# Patient Record
Sex: Female | Born: 1990 | Race: White | Hispanic: No | Marital: Single | Smoking: Never smoker
Health system: Southern US, Community
[De-identification: ages and names within clinical notes are randomized; demographics above are authoritative.]

## PROBLEM LIST (undated history)

## (undated) DIAGNOSIS — T7840XA Allergy, unspecified, initial encounter: Secondary | ICD-10-CM

## (undated) DIAGNOSIS — F909 Attention-deficit hyperactivity disorder, unspecified type: Secondary | ICD-10-CM

## (undated) HISTORY — DX: Allergy, unspecified, initial encounter: T78.40XA

## (undated) HISTORY — DX: Attention-deficit hyperactivity disorder, unspecified type: F90.9

---

## 2010-06-27 ENCOUNTER — Emergency Department (HOSPITAL_COMMUNITY)
Admission: EM | Admit: 2010-06-27 | Discharge: 2010-06-27 | Payer: Self-pay | Source: Home / Self Care | Admitting: Emergency Medicine

## 2011-02-26 LAB — CBC
HCT: 39.2 % (ref 36.0–46.0)
MCH: 31.5 pg (ref 26.0–34.0)
MCHC: 34.5 g/dL (ref 30.0–36.0)
WBC: 5.5 10*3/uL (ref 4.0–10.5)

## 2011-02-26 LAB — DIFFERENTIAL
Eosinophils Absolute: 0.1 10*3/uL (ref 0.0–0.7)
Eosinophils Relative: 1 % (ref 0–5)
Lymphocytes Relative: 42 % (ref 12–46)
Monocytes Relative: 7 % (ref 3–12)
Neutrophils Relative %: 49 % (ref 43–77)

## 2011-02-26 LAB — URINE MICROSCOPIC-ADD ON

## 2011-02-26 LAB — POCT I-STAT, CHEM 8
BUN: 16 mg/dL (ref 6–23)
Calcium, Ion: 1.27 mmol/L (ref 1.12–1.32)
Chloride: 105 mEq/L (ref 96–112)
Creatinine, Ser: 0.9 mg/dL (ref 0.4–1.2)
Hemoglobin: 14.3 g/dL (ref 12.0–15.0)
Potassium: 3.8 mEq/L (ref 3.5–5.1)
Sodium: 141 mEq/L (ref 135–145)
TCO2: 28 mmol/L (ref 0–100)

## 2011-02-26 LAB — URINALYSIS, ROUTINE W REFLEX MICROSCOPIC
Bilirubin Urine: NEGATIVE
Specific Gravity, Urine: 1.022 (ref 1.005–1.030)
pH: 6 (ref 5.0–8.0)

## 2013-06-25 ENCOUNTER — Encounter: Payer: Self-pay | Admitting: Obstetrics & Gynecology

## 2013-10-21 ENCOUNTER — Encounter: Payer: Self-pay | Admitting: Obstetrics & Gynecology

## 2013-10-21 ENCOUNTER — Ambulatory Visit (INDEPENDENT_AMBULATORY_CARE_PROVIDER_SITE_OTHER): Admitting: Obstetrics & Gynecology

## 2013-10-21 VITALS — BP 116/76 | HR 79 | Temp 98.2°F | Ht 61.0 in | Wt 115.0 lb

## 2013-10-21 DIAGNOSIS — Z0000 Encounter for general adult medical examination without abnormal findings: Secondary | ICD-10-CM

## 2013-10-21 DIAGNOSIS — Z01419 Encounter for gynecological examination (general) (routine) without abnormal findings: Secondary | ICD-10-CM

## 2013-10-21 LAB — POCT URINALYSIS DIPSTICK
Blood, UA: NEGATIVE
Glucose, UA: NEGATIVE
Ketones, UA: NEGATIVE
Leukocytes, UA: NEGATIVE
Protein, UA: NEGATIVE
Spec Grav, UA: 1.015
pH, UA: 6

## 2013-10-21 NOTE — Progress Notes (Signed)
Subjective:     Samantha Mcpherson is a 22 y.o. female here for a routine exam.  Current complaints: annual exam- patient reports she is doing well. Patient was treated recently for bladder infection..  Personal health questionnaire reviewed: no.   Gynecologic History Patient's last menstrual period was 10/11/2013. Contraception: OCP (estrogen/progesterone) Last Pap: never.   Obstetric History OB History  No data available     The following portions of the patient's history were reviewed and updated as appropriate: allergies, current medications, past family history, past medical history, past social history, past surgical history and problem list.  Review of Systems Pertinent items are noted in HPI.    Objective:      General appearance: alert Breasts: normal appearance, no masses or tenderness Abdomen: soft, non-tender; bowel sounds normal; no masses,  no organomegaly Pelvic: cervix normal in appearance, external genitalia normal, no adnexal masses or tenderness, uterus normal size, shape, and consistency and vagina normal without discharge       Assessment:    Healthy female exam.    Plan:   Counseled re: HPV vaccine Continue COCP F/U in 1 yre

## 2013-10-22 LAB — PAP IG W/ RFLX HPV ASCU

## 2013-10-24 ENCOUNTER — Encounter: Payer: Self-pay | Admitting: Obstetrics & Gynecology

## 2013-10-24 NOTE — Patient Instructions (Signed)

## 2013-12-17 ENCOUNTER — Other Ambulatory Visit: Payer: Self-pay | Admitting: Obstetrics & Gynecology

## 2014-04-04 ENCOUNTER — Other Ambulatory Visit: Payer: Self-pay | Admitting: Obstetrics & Gynecology

## 2014-07-12 ENCOUNTER — Other Ambulatory Visit: Payer: Self-pay | Admitting: Obstetrics & Gynecology

## 2014-07-16 ENCOUNTER — Other Ambulatory Visit: Payer: Self-pay | Admitting: *Deleted

## 2014-10-26 ENCOUNTER — Other Ambulatory Visit: Payer: Self-pay | Admitting: Obstetrics & Gynecology

## 2014-10-29 ENCOUNTER — Other Ambulatory Visit: Payer: Self-pay | Admitting: *Deleted

## 2014-10-29 MED ORDER — DROSPIRENONE-ETHINYL ESTRADIOL 3-0.02 MG PO TABS: ORAL_TABLET | ORAL | Status: DC

## 2014-10-29 NOTE — Progress Notes (Signed)
Pharmacy call to office requesting refill on [facility]. Call placed to pt making her aware that refill has been sent and that she will need to make an AEX appt to continue refill on medication.  Pt to have AEX scheduled in December.

## 2014-11-18 ENCOUNTER — Other Ambulatory Visit: Payer: Self-pay | Admitting: *Deleted

## 2014-11-18 DIAGNOSIS — Z3041 Encounter for surveillance of contraceptive pills: Secondary | ICD-10-CM

## 2014-11-18 MED ORDER — DROSPIRENONE-ETHINYL ESTRADIOL 3-0.02 MG PO TABS: ORAL_TABLET | ORAL | Status: DC

## 2014-12-08 ENCOUNTER — Ambulatory Visit: Admitting: Obstetrics & Gynecology

## 2014-12-08 ENCOUNTER — Encounter: Payer: Self-pay | Admitting: *Deleted

## 2014-12-09 ENCOUNTER — Encounter: Payer: Self-pay | Admitting: Obstetrics & Gynecology

## 2015-02-05 ENCOUNTER — Ambulatory Visit: Payer: Self-pay | Admitting: Family Medicine

## 2015-02-12 ENCOUNTER — Encounter: Payer: Self-pay | Admitting: Obstetrics

## 2015-02-12 ENCOUNTER — Ambulatory Visit (INDEPENDENT_AMBULATORY_CARE_PROVIDER_SITE_OTHER): Admitting: Obstetrics

## 2015-02-12 VITALS — BP 113/72 | HR 80 | Temp 98.6°F | Ht 61.0 in | Wt 119.0 lb

## 2015-02-12 DIAGNOSIS — Z3041 Encounter for surveillance of contraceptive pills: Secondary | ICD-10-CM

## 2015-02-12 DIAGNOSIS — Z01419 Encounter for gynecological examination (general) (routine) without abnormal findings: Secondary | ICD-10-CM | POA: Diagnosis not present

## 2015-02-12 MED ORDER — DROSPIRENONE-ETHINYL ESTRADIOL 3-0.02 MG PO TABS: ORAL_TABLET | ORAL | Status: DC

## 2015-02-13 ENCOUNTER — Encounter: Payer: Self-pay | Admitting: Obstetrics

## 2015-02-13 LAB — PAP IG W/ RFLX HPV ASCU

## 2015-02-13 NOTE — Progress Notes (Signed)
Subjective:        Samantha Mcpherson is a 24 y.o. female here for a routine exam.  Current complaints: None.    Personal health questionnaire:  Is patient [name], have a family history of breast and/or ovarian cancer: no Is there a family history of uterine cancer diagnosed at age < 42, gastrointestinal cancer, urinary tract cancer, family member who is a Field seismologist syndrome-associated carrier: no Is the patient overweight and hypertensive, family history of diabetes, personal history of gestational diabetes, preeclampsia or PCOS: no Is patient over 62, have PCOS,  family history of premature CHD under age 78, diabetes, smoke, have hypertension or peripheral artery disease:  no At any time, has a partner hit, kicked or otherwise hurt or frightened you?: no Over the past 2 weeks, have you felt down, depressed or hopeless?: no Over the past 2 weeks, have you felt little interest or pleasure in doing things?:no   Gynecologic History Patient's last menstrual period was 01/11/2015. Contraception: OCP (estrogen/progesterone) Last Pap: 2015. Results were: normal Last mammogram: n/a. Results were: n/a  Obstetric History OB History  No data available    Past Medical History  Diagnosis Date  . Allergy   . ADHD (attention deficit hyperactivity disorder)     History reviewed. No pertinent past surgical history.   Current outpatient prescriptions:  .  atomoxetine (STRATTERA) 10 MG capsule, Take 10 mg by mouth daily., Disp: , Rfl:  .  drospirenone-ethinyl estradiol (LORYNA) 3-0.02 MG tablet, TAKE AS DIRECTED, Disp: 28 tablet, Rfl: 2 .  fexofenadine (ALLEGRA) 30 MG tablet, Take 30 mg by mouth 2 (two) times daily., Disp: , Rfl:  .  Multiple Vitamins-Minerals (MULTIVITAMIN WITH MINERALS) tablet, Take 1 tablet by mouth daily., Disp: , Rfl:  Allergies  Allergen Reactions  . Amoxicillin Rash    History  Substance Use Topics  . Smoking status: Never Smoker   . Smokeless tobacco: Not  on file  . Alcohol Use: Yes     Comment: sometimes    Family History  Problem Relation Age of Onset  . Crohn's disease Mother   . Cancer Maternal Grandmother   . Heart disease Maternal Grandfather   . Heart disease Paternal Grandmother   . Stroke Paternal Grandmother       Review of Systems  Constitutional: negative for fatigue and weight loss Respiratory: negative for cough and wheezing Cardiovascular: negative for chest pain, fatigue and palpitations Gastrointestinal: negative for abdominal pain and change in bowel habits Musculoskeletal:negative for myalgias Neurological: negative for gait problems and tremors Behavioral/Psych: negative for abusive relationship, depression Endocrine: negative for temperature intolerance   Genitourinary:negative for abnormal menstrual periods, genital lesions, hot flashes, sexual problems and vaginal discharge Integument/breast: negative for breast lump, breast tenderness, nipple discharge and skin lesion(s)    Objective:       BP 113/72 mmHg  Pulse 80  Temp(Src) 98.6 F (37 C)  Ht 5\' 1"  (1.549 m)  Wt 119 lb (53.978 kg)  BMI 22.50 kg/m2  LMP 01/11/2015 General:   alert  Skin:   no rash or abnormalities  Lungs:   clear to auscultation bilaterally  Heart:   regular rate and rhythm, S1, S2 normal, no murmur, click, rub or gallop  Breasts:   normal without suspicious masses, skin or nipple changes or axillary nodes  Abdomen:  normal findings: no organomegaly, soft, non-tender and no hernia  Pelvis:  External genitalia: normal general appearance Urinary system: urethral meatus normal and bladder without fullness, nontender Vaginal:  normal without tenderness, induration or masses Cervix: normal appearance Adnexa: normal bimanual exam Uterus: anteverted and non-tender, normal size   Lab Review Urine pregnancy test Labs reviewed yes Radiologic studies reviewed no    Assessment:    Healthy female exam.    Contraceptive  Surveillance.  Pleased with OCP's   Plan:    Education reviewed: safe sex/STD prevention and weight bearing exercise. Contraception: OCP (estrogen/progesterone). Follow up in: 1 year.   Meds ordered this encounter  Medications  . drospirenone-ethinyl estradiol (LORYNA) 3-0.02 MG tablet    Sig: TAKE AS DIRECTED    Dispense:  28 tablet    Refill:  2   Orders Placed This Encounter  Procedures  . SureSwab, Vaginosis/Vaginitis Plus

## 2015-02-17 LAB — SURESWAB, VAGINOSIS/VAGINITIS PLUS
Atopobium vaginae: NOT DETECTED Log (cells/mL)
C. GLABRATA, DNA: NOT DETECTED
C. albicans, DNA: NOT DETECTED
C. parapsilosis, DNA: NOT DETECTED
C. trachomatis RNA, TMA: NOT DETECTED
C. tropicalis, DNA: NOT DETECTED
GARDNERELLA VAGINALIS: NOT DETECTED Log (cells/mL)
LACTOBACILLUS SPECIES: 6.5 Log (cells/mL)
MEGASPHAERA SPECIES: NOT DETECTED Log (cells/mL)
N. gonorrhoeae RNA, TMA: NOT DETECTED
T. vaginalis RNA, QL TMA: NOT DETECTED

## 2015-08-26 DIAGNOSIS — K580 Irritable bowel syndrome with diarrhea: Secondary | ICD-10-CM | POA: Insufficient documentation

## 2016-02-15 ENCOUNTER — Ambulatory Visit (INDEPENDENT_AMBULATORY_CARE_PROVIDER_SITE_OTHER): Payer: 59 | Admitting: Obstetrics

## 2016-02-15 ENCOUNTER — Encounter: Payer: Self-pay | Admitting: Obstetrics

## 2016-02-15 VITALS — BP 119/78 | HR 94 | Temp 98.5°F | Ht 61.0 in | Wt 124.8 lb

## 2016-02-15 DIAGNOSIS — Z3041 Encounter for surveillance of contraceptive pills: Secondary | ICD-10-CM

## 2016-02-15 DIAGNOSIS — Z01419 Encounter for gynecological examination (general) (routine) without abnormal findings: Secondary | ICD-10-CM | POA: Diagnosis not present

## 2016-02-15 MED ORDER — DROSPIRENONE-ETHINYL ESTRADIOL 3-0.02 MG PO TABS: ORAL_TABLET | ORAL | Status: DC

## 2016-02-16 ENCOUNTER — Other Ambulatory Visit: Payer: Self-pay | Admitting: *Deleted

## 2016-02-16 ENCOUNTER — Encounter: Payer: Self-pay | Admitting: Obstetrics

## 2016-02-16 LAB — PAP IG W/ RFLX HPV ASCU

## 2016-02-16 NOTE — Progress Notes (Signed)
Subjective:        Samantha Mcpherson is a 25 y.o. female here for a routine exam.  Current complaints: None.    Personal health questionnaire:  Is patient Samantha Mcpherson, have a family history of breast and/or ovarian cancer: no Is there a family history of uterine cancer diagnosed at age < 43, gastrointestinal cancer, urinary tract cancer, family member who is a Field seismologist syndrome-associated carrier: no Is the patient overweight and hypertensive, family history of diabetes, personal history of gestational diabetes, preeclampsia or PCOS: no Is patient over 24, have PCOS,  family history of premature CHD under age 59, diabetes, smoke, have hypertension or peripheral artery disease:  no At any time, has a partner hit, kicked or otherwise hurt or frightened you?: no Over the past 2 weeks, have you felt down, depressed or hopeless?: no Over the past 2 weeks, have you felt little interest or pleasure in doing things?:no   Gynecologic History Patient's last menstrual period was 02/15/2016 (exact date). Contraception: OCP (estrogen/progesterone) Last Pap: 2016. Results were: normal Last mammogram: n/a. Results were: n/a  Obstetric History OB History  No data available    Past Medical History  Diagnosis Date  . Allergy   . ADHD (attention deficit hyperactivity disorder)     History reviewed. No pertinent past surgical history.   Current outpatient prescriptions:  .  atomoxetine (STRATTERA) 10 MG capsule, Take 10 mg by mouth daily., Disp: , Rfl:  .  drospirenone-ethinyl estradiol (LORYNA) 3-0.02 MG tablet, TAKE AS DIRECTED, Disp: 84 tablet, Rfl: 4 .  fexofenadine (ALLEGRA) 30 MG tablet, Take 30 mg by mouth 2 (two) times daily., Disp: , Rfl:  .  Multiple Vitamins-Minerals (MULTIVITAMIN WITH MINERALS) tablet, Take 1 tablet by mouth daily., Disp: , Rfl:  Allergies  Allergen Reactions  . Amoxicillin Rash    Social History  Substance Use Topics  . Smoking status: Never Smoker   .  Smokeless tobacco: Not on file  . Alcohol Use: 0.0 oz/week    0 Standard drinks or equivalent per week     Comment: sometimes    Family History  Problem Relation Age of Onset  . Crohn's disease Mother   . Cancer Maternal Grandmother   . Heart disease Maternal Grandfather   . Heart disease Paternal Grandmother   . Stroke Paternal Grandmother       Review of Systems  Constitutional: negative for fatigue and weight loss Respiratory: negative for cough and wheezing Cardiovascular: negative for chest pain, fatigue and palpitations Gastrointestinal: negative for abdominal pain and change in bowel habits Musculoskeletal:negative for myalgias Neurological: negative for gait problems and tremors Behavioral/Psych: negative for abusive relationship, depression Endocrine: negative for temperature intolerance   Genitourinary:negative for abnormal menstrual periods, genital lesions, hot flashes, sexual problems and vaginal discharge Integument/breast: negative for breast lump, breast tenderness, nipple discharge and skin lesion(s)    Objective:       BP 119/78 mmHg  Pulse 94  Temp(Src) 98.5 F (36.9 C)  Ht 5\' 1"  (1.549 m)  Wt 124 lb 12.8 oz (56.609 kg)  BMI 23.59 kg/m2  LMP 02/15/2016 (Exact Date) General:   alert  Skin:   no rash or abnormalities  Lungs:   clear to auscultation bilaterally  Heart:   regular rate and rhythm, S1, S2 normal, no murmur, click, rub or gallop  Breasts:   normal without suspicious masses, skin or nipple changes or axillary nodes  Abdomen:  normal findings: no organomegaly, soft, non-tender and no hernia  Pelvis:  External genitalia: normal general appearance Urinary system: urethral meatus normal and bladder without fullness, nontender Vaginal: normal without tenderness, induration or masses Cervix: normal appearance Adnexa: normal bimanual exam Uterus: anteverted and non-tender, normal size   Lab Review Urine pregnancy test Labs reviewed  yes Radiologic studies reviewed no    Assessment:    Healthy female exam.    Contraceptive management   Plan:    Education reviewed: low fat, low cholesterol diet, safe sex/STD prevention and weight bearing exercise. Contraception: OCP (estrogen/progesterone).    Meds ordered this encounter  Medications  . drospirenone-ethinyl estradiol (LORYNA) 3-0.02 MG tablet    Sig: TAKE AS DIRECTED    Dispense:  84 tablet    Refill:  4   Orders Placed This Encounter  Procedures  . SureSwab Bacterial Vaginosis/itis  . SureSwab, Vaginosis/Vaginitis Plus

## 2016-02-18 LAB — SURESWAB BACTERIAL VAGINOSIS/ITIS
ATOPOBIUM VAGINAE: NOT DETECTED Log (cells/mL)
C. PARAPSILOSIS, DNA: NOT DETECTED
C. TROPICALIS, DNA: NOT DETECTED
C. albicans, DNA: DETECTED — AB
C. glabrata, DNA: NOT DETECTED
LACTOBACILLUS SPECIES: 7.5 Log (cells/mL)
MEGASPHAERA SPECIES: NOT DETECTED Log (cells/mL)
T. VAGINALIS RNA, QL TMA: NOT DETECTED

## 2016-02-19 ENCOUNTER — Other Ambulatory Visit: Payer: Self-pay | Admitting: Obstetrics

## 2016-02-19 DIAGNOSIS — B3731 Acute candidiasis of vulva and vagina: Secondary | ICD-10-CM

## 2016-02-19 DIAGNOSIS — B373 Candidiasis of vulva and vagina: Secondary | ICD-10-CM

## 2016-02-19 MED ORDER — FLUCONAZOLE 150 MG PO TABS: 150.0000 mg | ORAL_TABLET | Freq: Once | ORAL | Status: DC

## 2016-12-16 DIAGNOSIS — D2262 Melanocytic nevi of left upper limb, including shoulder: Secondary | ICD-10-CM | POA: Diagnosis not present

## 2016-12-16 DIAGNOSIS — D485 Neoplasm of uncertain behavior of skin: Secondary | ICD-10-CM | POA: Diagnosis not present

## 2016-12-16 DIAGNOSIS — D225 Melanocytic nevi of trunk: Secondary | ICD-10-CM | POA: Diagnosis not present

## 2016-12-16 DIAGNOSIS — D2372 Other benign neoplasm of skin of left lower limb, including hip: Secondary | ICD-10-CM | POA: Diagnosis not present

## 2016-12-16 DIAGNOSIS — D1801 Hemangioma of skin and subcutaneous tissue: Secondary | ICD-10-CM | POA: Diagnosis not present

## 2017-02-15 ENCOUNTER — Ambulatory Visit: Payer: Self-pay | Admitting: Obstetrics

## 2017-02-16 DIAGNOSIS — D485 Neoplasm of uncertain behavior of skin: Secondary | ICD-10-CM | POA: Diagnosis not present

## 2017-06-03 ENCOUNTER — Encounter (HOSPITAL_COMMUNITY): Payer: Self-pay

## 2017-06-03 ENCOUNTER — Emergency Department (HOSPITAL_COMMUNITY): Payer: 59

## 2017-06-03 DIAGNOSIS — L02415 Cutaneous abscess of right lower limb: Secondary | ICD-10-CM | POA: Insufficient documentation

## 2017-06-03 DIAGNOSIS — M7989 Other specified soft tissue disorders: Secondary | ICD-10-CM | POA: Diagnosis present

## 2017-06-03 DIAGNOSIS — F909 Attention-deficit hyperactivity disorder, unspecified type: Secondary | ICD-10-CM | POA: Insufficient documentation

## 2017-06-03 DIAGNOSIS — M25561 Pain in right knee: Secondary | ICD-10-CM | POA: Diagnosis not present

## 2017-06-03 DIAGNOSIS — L03115 Cellulitis of right lower limb: Secondary | ICD-10-CM | POA: Diagnosis not present

## 2017-06-03 NOTE — ED Triage Notes (Signed)
Pt complaining of redness and swelling to R knee. Pt states seen by PCP, states given abx cream for same. Pt states increasing redness and swelling. Pt states fell onto R knee several weeks ago while walking dog. Pt denies any new injury/trauma since. Pt complaining of serosanguinous drainage.

## 2017-06-04 ENCOUNTER — Emergency Department (HOSPITAL_COMMUNITY)
Admission: EM | Admit: 2017-06-04 | Discharge: 2017-06-04 | Disposition: A | Discharge status: Home / Self Care | Payer: 59 | Attending: Emergency Medicine | Admitting: Emergency Medicine

## 2017-06-04 DIAGNOSIS — L03115 Cellulitis of right lower limb: Secondary | ICD-10-CM

## 2017-06-04 DIAGNOSIS — L02415 Cutaneous abscess of right lower limb: Secondary | ICD-10-CM

## 2017-06-04 MED ORDER — LIDOCAINE-EPINEPHRINE (PF) 2 %-1:200000 IJ SOLN
20.0000 mL | Freq: Once | INTRAMUSCULAR | Status: AC
  Administered 2017-06-04: 20 mL via INTRADERMAL
  Filled 2017-06-04: qty 20

## 2017-06-04 MED ORDER — DOXYCYCLINE HYCLATE 100 MG PO CAPS: 100.0000 mg | ORAL_CAPSULE | Freq: Two times a day (BID) | ORAL | 0 refills | Status: DC

## 2017-06-04 MED ORDER — DOXYCYCLINE HYCLATE 100 MG PO TABS
100.0000 mg | ORAL_TABLET | Freq: Once | ORAL | Status: AC
  Administered 2017-06-04: 100 mg via ORAL
  Filled 2017-06-04: qty 1

## 2017-06-04 NOTE — ED Provider Notes (Signed)
[name] DEPT Provider Note   CSN: [id number] Arrival date & time: 06/03/17  2225  By signing my name below, I, Ny'Kea Lewis, attest that this documentation has been prepared under the direction and in the presence of Sherwood Gambler, MD. Electronically Signed: Lise Auer, ED Scribe. 06/04/17. 1:59 AM.  History   Chief Complaint Chief Complaint  Patient presents with  . Knee Pain   The history is provided by the patient and a parent. No language interpreter was used.    HPI HPI Comments: Samantha Mcpherson is a 26 y.o. female with no pertinent history who presents to the Emergency Department complaining of gradually worsening right leg infection that began four days ago. Pt notes associated discomfort with ambulation, swelling, and redness to the area. Today she notes a new onset of redness and swelling to the right leg. Pt had a mechanical fall one week ago while walking her dog where she sustained a scrap to the right knee. She reports the area was healing when a small bump formed and she attempted to drain area that opened and continued to grow in size. She saw her PCP four days ago and she was prescribed an abx cream and Bactrim. She reports the dosage of the medication was too strong so she decreased the amount she took. She has been doing warm compresses and cleaning the area with peroxide with mild relief. Denies fever, nausea, or emesis.    Past Medical History:  Diagnosis Date  . ADHD (attention deficit hyperactivity disorder)   . Allergy     There are no active problems to display for this patient.   History reviewed. No pertinent surgical history.  OB History    No data available      Home Medications    Prior to Admission medications   Medication Sig Start Date End Date Taking? Authorizing Provider  atomoxetine (STRATTERA) 10 MG capsule Take 10 mg by mouth daily.    [provider]  doxycycline (VIBRAMYCIN) 100 MG capsule Take 1 capsule (100 mg  total) by mouth 2 (two) times daily. One po bid x 7 days 06/04/17   Sherwood Gambler, MD  drospirenone-ethinyl estradiol (LORYNA) 3-0.02 MG tablet TAKE AS DIRECTED 02/15/16   Shelly Bombard, MD  fexofenadine (ALLEGRA) 30 MG tablet Take 30 mg by mouth 2 (two) times daily.    [provider]  fluconazole (DIFLUCAN) 150 MG tablet Take 1 tablet (150 mg total) by mouth once. 02/19/16   Shelly Bombard, MD  Multiple Vitamins-Minerals (MULTIVITAMIN WITH MINERALS) tablet Take 1 tablet by mouth daily.    [provider]    Family History Family History  Problem Relation Age of Onset  . Crohn's disease Mother   . Cancer Maternal Grandmother   . Heart disease Maternal Grandfather   . Heart disease Paternal Grandmother   . Stroke Paternal Grandmother     Social History Social History  Substance Use Topics  . Smoking status: Never Smoker  . Smokeless tobacco: Never Used  . Alcohol use 0.0 oz/week     Comment: sometimes   Allergies   Amoxicillin   Review of Systems Review of Systems  Constitutional: Negative for fever.  Gastrointestinal: Negative for nausea and vomiting.  Skin: Positive for color change and wound.  All other systems reviewed and are negative.   Physical Exam Updated Vital Signs BP 106/69 (BP Location: Right Arm)   Pulse 69   Temp 97.8 F (36.6 C)   Resp 12  Ht 5\' 1"  (1.549 m)   Wt 57.2 kg (126 lb)   LMP 05/13/2017 (Approximate)   SpO2 100%   BMI 23.81 kg/m   Physical Exam  Constitutional: She is oriented to person, place, and time. She appears well-developed and well-nourished.  HENT:  Head: Normocephalic and atraumatic.  Right Ear: External ear normal.  Left Ear: External ear normal.  Nose: Nose normal.  Eyes: Right eye exhibits no discharge. Left eye exhibits no discharge.  Cardiovascular: Normal rate, regular rhythm and normal heart sounds.   Pulmonary/Chest: Effort normal and breath sounds normal.  Abdominal: Soft. There is no  tenderness.  Musculoskeletal:  Small wound with mild induration over the right pre-patellar bursa. Erthyema tracking distally over right lower leg. Mild warmth. Normal passive and active ROM of the knee. No joint effusion.   Neurological: She is alert and oriented to person, place, and time.  Skin: Skin is warm and dry.  Nursing note and vitals reviewed.   ED Treatments / Results  DIAGNOSTIC STUDIES: Oxygen Saturation is 99% on RA, normal by my interpretation.   COORDINATION OF CARE: 1:36 AM-Discussed next steps with pt. Pt verbalized understanding and is agreeable with the plan. ' Labs (all labs ordered are listed, but only abnormal results are displayed) Labs Reviewed - No data to display  EKG  EKG Interpretation None       Radiology Dg Knee Complete 4 Views Right  Result Date: 06/03/2017 CLINICAL DATA:  Right knee pain after fall several weeks prior. Redness and swelling. Recent primary care visit for same, progressive symptoms. EXAM: RIGHT KNEE - COMPLETE 4+ VIEW COMPARISON:  None. FINDINGS: No evidence of fracture or dislocation. No bony destructive change. No evidence of arthropathy or other focal bone abnormality. Trace knee joint effusion. There is diffuse soft tissue edema. No tracking soft tissue air. No radiopaque foreign body. IMPRESSION: Diffuse soft tissue edema. Trace joint effusion. No osseous abnormality. Electronically Signed   By: Jeb Levering M.D.   On: 06/03/2017 23:57    Procedures Procedures (including critical care time) EMERGENCY DEPARTMENT US SOFT TISSUE INTERPRETATION "Study: Limited Soft Tissue Ultrasound"  INDICATIONS: Soft tissue infection Multiple views of the body part were obtained in real-time with a multi-frequency linear probe  PERFORMED BY: Myself IMAGES ARCHIVED?: Yes SIDE:Right  BODY PART:Lower extremity INTERPRETATION:  Abcess present and Cellulitis present  INCISION AND DRAINAGE Performed by: Sherwood Gambler T Consent:  Verbal consent obtained. Risks and benefits: risks, benefits and alternatives were discussed Type: abscess  Body area: Right lower leg  Anesthesia: local infiltration  Incision was made with a scalpel.  Local anesthetic: lidocaine 2% w epinephrine  Anesthetic total: 4 ml  Complexity: complex Blunt dissection to break up loculations  Drainage: purulent  Drainage amount: small  No Packing  Patient tolerance: Patient tolerated the procedure well with no immediate complications.    Medications Ordered in ED Medications  lidocaine-EPINEPHrine (XYLOCAINE W/EPI) 2 %-1:200000 (PF) injection 20 mL (20 mLs Intradermal Given by Other 06/04/17 0258)  doxycycline (VIBRA-TABS) tablet 100 mg (100 mg Oral Given 06/04/17 0331)     Initial Impression / Assessment and Plan / ED Course  I have reviewed the triage vital signs and the nursing notes.  Pertinent labs & imaging results that were available during my care of the patient were reviewed by me and considered in my medical decision making (see chart for details).     Bedside ultrasound shows a small fluid collection over the area where the original injury occurred.  Due to this, incision and drainage was obtained which had a small amount of pus. Otherwise, her antibiotics will be changed to doxycycline. The Bactrim does not appear to be an effective and due to side effects she is not taking the full dose anyway. She is overall well appearing and is afebrile with no vomiting. I think it is reasonable to try oral antibiotics and if not improving may need admission and IV antibiotics. Discussed need for close follow-up with her PCP for wound check. Discussed return precautions.  Final Clinical Impressions(s) / ED Diagnoses   Final diagnoses:  Cellulitis and abscess of right lower extremity    New Prescriptions Discharge Medication List as of 06/04/2017  3:08 AM    START taking these medications   Details  doxycycline (VIBRAMYCIN) 100  MG capsule Take 1 capsule (100 mg total) by mouth 2 (two) times daily. One po bid x 7 days, Starting Sun 06/04/2017, Print       I personally performed the services described in this documentation, which was scribed in my presence. The recorded information has been reviewed and is accurate.     Sherwood Gambler, MD 06/04/17 [phone]

## 2017-06-08 ENCOUNTER — Ambulatory Visit (HOSPITAL_COMMUNITY)
Admission: EM | Admit: 2017-06-08 | Discharge: 2017-06-08 | Disposition: A | Discharge status: Home / Self Care | Payer: 59 | Attending: Internal Medicine | Admitting: Internal Medicine

## 2017-06-08 ENCOUNTER — Encounter (HOSPITAL_COMMUNITY): Payer: Self-pay | Admitting: Emergency Medicine

## 2017-06-08 DIAGNOSIS — Z09 Encounter for follow-up examination after completed treatment for conditions other than malignant neoplasm: Secondary | ICD-10-CM

## 2017-06-08 DIAGNOSIS — S80211D Abrasion, right knee, subsequent encounter: Secondary | ICD-10-CM | POA: Diagnosis not present

## 2017-06-08 NOTE — ED Provider Notes (Signed)
CSN: [id number]     Arrival date & time 06/08/17  1554 History   First MD Initiated Contact with Patient 06/08/17 1613     Chief Complaint  Patient presents with  . Wound Check   (Consider location/radiation/quality/duration/timing/severity/associated sxs/prior Treatment) 26 year old female presents for a wound check. Several days ago she was seen in the emergency department after a scrape on her right knee became infected. Ultrasound refilled small pocket of pus beneath the wound and there had developed cellulitis. I and D was performed and she was placed on doxycycline. She states the wound looks much better she is having no pain and she has full function of the knee.      Past Medical History:  Diagnosis Date  . ADHD (attention deficit hyperactivity disorder)   . Allergy    History reviewed. No pertinent surgical history. Family History  Problem Relation Age of Onset  . Crohn's disease Mother   . Cancer Maternal Grandmother   . Heart disease Maternal Grandfather   . Heart disease Paternal Grandmother   . Stroke Paternal Grandmother    Social History  Substance Use Topics  . Smoking status: Never Smoker  . Smokeless tobacco: Never Used  . Alcohol use 0.0 oz/week     Comment: sometimes   OB History    No data available     Review of Systems  All other systems reviewed and are negative.   Allergies  Amoxicillin  Home Medications   Prior to Admission medications   Medication Sig Start Date End Date Taking? Authorizing Provider  atomoxetine (STRATTERA) 10 MG capsule Take 10 mg by mouth daily.    [provider]  doxycycline (VIBRAMYCIN) 100 MG capsule Take 1 capsule (100 mg total) by mouth 2 (two) times daily. One po bid x 7 days 06/04/17   Sherwood Gambler, MD  drospirenone-ethinyl estradiol (LORYNA) 3-0.02 MG tablet TAKE AS DIRECTED 02/15/16   Shelly Bombard, MD  fexofenadine (ALLEGRA) 30 MG tablet Take 30 mg by mouth 2 (two) times daily.    [provider]  fluconazole (DIFLUCAN) 150 MG tablet Take 1 tablet (150 mg total) by mouth once. 02/19/16   Shelly Bombard, MD  Multiple Vitamins-Minerals (MULTIVITAMIN WITH MINERALS) tablet Take 1 tablet by mouth daily.    [provider]   Meds Ordered and Administered this Visit  Medications - No data to display  BP 116/76 (BP Location: Right Arm)   Pulse 81   Temp 98.9 F (37.2 C) (Oral)   Resp 16   LMP 05/13/2017 (Approximate)   SpO2 100%  No data found.   Physical Exam  Constitutional: She appears well-developed and well-nourished. No distress.  Musculoskeletal: Normal range of motion. She exhibits no edema, tenderness or deformity.  Skin: Skin is warm and dry.  The wound is now a 1 cm scab. There is no erythema, no swelling or puffiness no tenderness no lymphangitis no signs of infection. It is healing well. She demonstrates normal function of the knee, normal range of motion.  Psychiatric: She has a normal mood and affect.  Nursing note and vitals reviewed.   Urgent Care Course     Procedures (including critical care time)  Labs Review Labs Reviewed - No data to display  Imaging Review No results found.   Visual Acuity Review  Right Eye Distance:   Left Eye Distance:   Bilateral Distance:    Right Eye Near:   Left Eye Near:    Bilateral Near:  MDM   1. Encounter for recheck of abscess following incision and drainage    The wound appears to be healing very well. There are no signs of infection at this time. All you need to do is keep it clean with soap and water daily and if there is any drainage forming scab would recommend using a Band-Aid. Otherwise you can leave it open to the air and let it dry and complete healing. If you develop redness, swelling or a puslike drainage then return promptly. I think this will heal up near completion in less than a week.     Janne Napoleon, NP 06/08/17 1633    Janne Napoleon, NP 06/08/17 [phone]

## 2017-06-08 NOTE — Discharge Instructions (Signed)
The wound appears to be healing very well. There are no signs of infection at this time. All you need to do is keep it clean with soap and water daily and if there is any drainage forming scab would recommend using a Band-Aid. Otherwise you can leave it open to the air and let it dry and complete healing. If you develop redness, swelling or a puslike drainage then return promptly. I think this will heal up near completion in less than a week.

## 2017-06-08 NOTE — ED Triage Notes (Signed)
Fall 3 weeks ago.  Patient reports it was healing.  Then it started worsening in swelling and pain.  .  Seen in the ed, I/d and provided antibiotic.  Patient was told to have a wound check since no pcp.  Patient reports eight knee is feeling better, looks better and redness is gone.

## 2017-10-19 ENCOUNTER — Encounter: Payer: Self-pay | Admitting: Family Medicine

## 2017-10-19 ENCOUNTER — Ambulatory Visit: Payer: 59 | Admitting: Family Medicine

## 2017-10-19 ENCOUNTER — Other Ambulatory Visit: Payer: Self-pay

## 2017-10-19 VITALS — BP 112/80 | HR 85 | Temp 98.1°F | Resp 16 | Ht 61.0 in | Wt 136.1 lb

## 2017-10-19 DIAGNOSIS — Z01419 Encounter for gynecological examination (general) (routine) without abnormal findings: Secondary | ICD-10-CM | POA: Diagnosis not present

## 2017-10-19 DIAGNOSIS — E785 Hyperlipidemia, unspecified: Secondary | ICD-10-CM

## 2017-10-19 DIAGNOSIS — Z0000 Encounter for general adult medical examination without abnormal findings: Secondary | ICD-10-CM

## 2017-10-19 LAB — CBC WITH DIFFERENTIAL/PLATELET
BASOS ABS: 0 10*3/uL (ref 0.0–0.1)
Basophils Relative: 0.6 % (ref 0.0–3.0)
Eosinophils Absolute: 0 10*3/uL (ref 0.0–0.7)
Eosinophils Relative: 0.6 % (ref 0.0–5.0)
HEMATOCRIT: 42.4 % (ref 36.0–46.0)
Hemoglobin: 14.5 g/dL (ref 12.0–15.0)
LYMPHS PCT: 27.8 % (ref 12.0–46.0)
Lymphs Abs: 1.8 10*3/uL (ref 0.7–4.0)
MCHC: 34.1 g/dL (ref 30.0–36.0)
MCV: 89.7 fl (ref 78.0–100.0)
MONOS PCT: 5.8 % (ref 3.0–12.0)
Monocytes Absolute: 0.4 10*3/uL (ref 0.1–1.0)
NEUTROS PCT: 65.2 % (ref 43.0–77.0)
Neutro Abs: 4.3 10*3/uL (ref 1.4–7.7)
Platelets: 292 10*3/uL (ref 150.0–400.0)
RBC: 4.73 Mil/uL (ref 3.87–5.11)
RDW: 12.6 % (ref 11.5–15.5)
WBC: 6.6 10*3/uL (ref 4.0–10.5)

## 2017-10-19 LAB — TSH: TSH: 1.38 u[IU]/mL (ref 0.35–4.50)

## 2017-10-19 LAB — BASIC METABOLIC PANEL
BUN: 9 mg/dL (ref 6–23)
CALCIUM: 10 mg/dL (ref 8.4–10.5)
CO2: 25 meq/L (ref 19–32)
Chloride: 101 mEq/L (ref 96–112)
Creatinine, Ser: 0.68 mg/dL (ref 0.40–1.20)
GFR: 111.06 mL/min (ref >60.00)
GLUCOSE: 89 mg/dL (ref 70–99)
Potassium: 3.7 mEq/L (ref 3.5–5.1)
Sodium: 136 mEq/L (ref 135–145)

## 2017-10-19 LAB — VITAMIN D 25 HYDROXY (VIT D DEFICIENCY, FRACTURES): VITD: 25.63 ng/mL — ABNORMAL LOW (ref 30.00–100.00)

## 2017-10-19 LAB — HEPATIC FUNCTION PANEL
ALBUMIN: 4.9 g/dL (ref 3.5–5.2)
ALT: 15 U/L (ref 0–35)
AST: 17 U/L (ref 0–37)
Alkaline Phosphatase: 63 U/L (ref 39–117)
Bilirubin, Direct: 0.1 mg/dL (ref 0.0–0.3)
TOTAL PROTEIN: 7.8 g/dL (ref 6.0–8.3)
Total Bilirubin: 0.8 mg/dL (ref 0.2–1.2)

## 2017-10-19 LAB — LIPID PANEL
CHOLESTEROL: 194 mg/dL (ref 0–200)
HDL: 67.2 mg/dL (ref >39.00)
LDL CALC: 116 mg/dL — AB (ref 0–99)
NonHDL: 126.64
TRIGLYCERIDES: 52 mg/dL (ref 0.0–149.0)
Total CHOL/HDL Ratio: 3
VLDL: 10.4 mg/dL (ref 0.0–40.0)

## 2017-10-19 NOTE — Assessment & Plan Note (Signed)
Pt's PE WNL.  Will refer to GYN at pt's request.  She is going to check on the date of her last Tdap.  Check labs.  Anticipatory guidance provided.

## 2017-10-19 NOTE — Patient Instructions (Signed)
Follow up in 1 year or as needed We'll notify you of your lab results and make any changes if needed Continue to work on healthy diet and regular exercise- you look great! We'll call you with your GYN appt Ask work or check your old records about your most recent tetanus shot Call with any questions or concerns Welcome!  We're glad to have you!!!

## 2017-10-19 NOTE — Progress Notes (Signed)
   Subjective:    Patient ID: Samantha Mcpherson, female    DOB: 1991/04/23, 26 y.o.   MRN: [id number]  HPI New to establish.  GYNJodi Mourning but is looking for new provider.  CPE- no concerns today.   Review of Systems Patient reports no vision/ hearing changes, adenopathy,fever, weight change,  persistant/recurrent hoarseness , swallowing issues, chest pain, palpitations, edema, persistant/recurrent cough, hemoptysis, dyspnea (rest/exertional/paroxysmal nocturnal), gastrointestinal bleeding (melena, rectal bleeding), abdominal pain, significant heartburn, bowel changes, GU symptoms (dysuria, hematuria, incontinence), Gyn symptoms (abnormal  bleeding, pain),  syncope, focal weakness, memory loss, numbness & tingling, skin/hair/nail changes, abnormal bruising or bleeding, anxiety, or depression.     Objective:   Physical Exam General Appearance:    Alert, cooperative, no distress, appears stated age  Head:    Normocephalic, without obvious abnormality, atraumatic  Eyes:    PERRL, conjunctiva/corneas clear, EOM's intact, fundi    benign, both eyes  Ears:    Normal TM's and external ear canals, both ears  Nose:   Nares normal, septum midline, mucosa normal, no drainage    or sinus tenderness  Throat:   Lips, mucosa, and tongue normal; teeth and gums normal  Neck:   Supple, symmetrical, trachea midline, no adenopathy;    Thyroid: no enlargement/tenderness/nodules  Back:     Symmetric, no curvature, ROM normal, no CVA tenderness  Lungs:     Clear to auscultation bilaterally, respirations unlabored  Chest Wall:    No tenderness or deformity   Heart:    Regular rate and rhythm, S1 and S2 normal, no murmur, rub   or gallop  Breast Exam:    Deferred to GYN  Abdomen:     Soft, non-tender, bowel sounds active all four quadrants,    no masses, no organomegaly  Genitalia:    Deferred to GYN  Rectal:    Extremities:   Extremities normal, atraumatic, no cyanosis or edema  Pulses:   2+ and symmetric  all extremities  Skin:   Skin color, texture, turgor normal, no rashes or lesions  Lymph nodes:   Cervical, supraclavicular, and axillary nodes normal  Neurologic:   CNII-XII intact, normal strength, sensation and reflexes    throughout          Assessment & Plan:

## 2017-10-20 ENCOUNTER — Other Ambulatory Visit: Payer: Self-pay | Admitting: *Deleted

## 2017-10-20 MED ORDER — VITAMIN D (ERGOCALCIFEROL) 1.25 MG (50000 UNIT) PO CAPS: 50000.0000 [IU] | ORAL_CAPSULE | ORAL | 0 refills | Status: AC

## 2017-11-01 ENCOUNTER — Ambulatory Visit: Payer: Self-pay

## 2017-11-01 NOTE — Telephone Encounter (Signed)
Routed to PCP to advise.

## 2017-11-01 NOTE — Telephone Encounter (Signed)
If the 50,000 unit supplement is intolerable, she can get an OTC supplement of 5,000 units daily.  This should be easier for her body to handle and allow her system time to adjust.  Miralax is fine to take to help w/ constipation and bloating.

## 2017-11-01 NOTE — Telephone Encounter (Signed)
Reported she started on Vita D 50,000 units q week on 11/14, and questions if it has affected her IBS.  Reported nausea and change in bowel pattern since Saturday, 11/16.  Stated she vomited small amt. mucus on Sat. AM.   Reported with her IBS, she usually has daily stool. Has not had a BM in 3 days.  Reported bloating, feeling full, and inability to eat normal portions, due to these symptoms.  Reported some chills on Sunday and Monday; denies any chills in past 2 days.    Reported she has used Miralax to regulate her IBS in the past, and questioned if this would be okay with the Vita D.  (checked Micromedex and did not see any drug-drug conflict with Vita D and Miralax.)  Advised to check with her Pharmacist too.   Questioned if she should take her Vita D today?  Recommended to wait until tomorrow to take the Vita D, and try to get bowels moving today.  Pt. verb. understanding.     Advised will make Dr. Birdie Riddle aware of change in bowel pattern, since starting Vita D 50,000 units, weekly.    Answer Assessment - Initial Assessment Questions 1. SYMPTOMS: "Do you have any symptoms?"     Nausea after starting Vita D 50,000 units q week 2. SEVERITY: If symptoms are present, ask "Are they mild, moderate or severe?"     Severe in the morning, then improves during day.  Protocols used: MEDICATION QUESTION CALL-A-AH

## 2017-11-01 NOTE — Telephone Encounter (Signed)
Patient has been informed of PCP recommendations. Stated verbal understanding.

## 2017-11-05 ENCOUNTER — Emergency Department (HOSPITAL_COMMUNITY)
Admission: EM | Admit: 2017-11-05 | Discharge: 2017-11-05 | Disposition: A | Discharge status: Home / Self Care | Payer: 59 | Attending: Emergency Medicine | Admitting: Emergency Medicine

## 2017-11-05 ENCOUNTER — Emergency Department (HOSPITAL_COMMUNITY): Payer: 59

## 2017-11-05 ENCOUNTER — Encounter (HOSPITAL_COMMUNITY): Payer: Self-pay | Admitting: *Deleted

## 2017-11-05 DIAGNOSIS — Z3A01 Less than 8 weeks gestation of pregnancy: Secondary | ICD-10-CM | POA: Diagnosis not present

## 2017-11-05 DIAGNOSIS — R110 Nausea: Secondary | ICD-10-CM | POA: Diagnosis not present

## 2017-11-05 DIAGNOSIS — R109 Unspecified abdominal pain: Secondary | ICD-10-CM | POA: Diagnosis present

## 2017-11-05 DIAGNOSIS — Z3490 Encounter for supervision of normal pregnancy, unspecified, unspecified trimester: Secondary | ICD-10-CM

## 2017-11-05 DIAGNOSIS — O219 Vomiting of pregnancy, unspecified: Secondary | ICD-10-CM | POA: Insufficient documentation

## 2017-11-05 DIAGNOSIS — Z79899 Other long term (current) drug therapy: Secondary | ICD-10-CM | POA: Diagnosis not present

## 2017-11-05 LAB — URINALYSIS, ROUTINE W REFLEX MICROSCOPIC
Bilirubin Urine: NEGATIVE
Glucose, UA: NEGATIVE mg/dL
Hgb urine dipstick: NEGATIVE
Ketones, ur: 20 mg/dL — AB
Leukocytes, UA: NEGATIVE
Nitrite: NEGATIVE
Protein, ur: NEGATIVE mg/dL
Specific Gravity, Urine: 1.005 (ref 1.005–1.030)
pH: 7 (ref 5.0–8.0)

## 2017-11-05 LAB — COMPREHENSIVE METABOLIC PANEL
ALT: 5 U/L — ABNORMAL LOW (ref 14–54)
AST: 21 U/L (ref 15–41)
Albumin: 4 g/dL (ref 3.5–5.0)
Alkaline Phosphatase: 56 U/L (ref 38–126)
Anion gap: 7 (ref 5–15)
BUN: 8 mg/dL (ref 6–20)
CO2: 22 mmol/L (ref 22–32)
Calcium: 9.2 mg/dL (ref 8.9–10.3)
Chloride: 104 mmol/L (ref 101–111)
Creatinine, Ser: 0.62 mg/dL (ref 0.44–1.00)
GFR calc Af Amer: >60 mL/min (ref >60)
GFR calc non Af Amer: >60 mL/min (ref >60)
Glucose, Bld: 111 mg/dL — ABNORMAL HIGH (ref 65–99)
Potassium: 4.1 mmol/L (ref 3.5–5.1)
Sodium: 133 mmol/L — ABNORMAL LOW (ref 135–145)
Total Bilirubin: 1.3 mg/dL — ABNORMAL HIGH (ref 0.3–1.2)
Total Protein: 6.7 g/dL (ref 6.5–8.1)

## 2017-11-05 LAB — CBC
HCT: 39.3 % (ref 36.0–46.0)
Hemoglobin: 13.7 g/dL (ref 12.0–15.0)
MCH: 30.8 pg (ref 26.0–34.0)
MCHC: 34.9 g/dL (ref 30.0–36.0)
MCV: 88.3 fL (ref 78.0–100.0)
Platelets: 238 10*3/uL (ref 150–400)
RBC: 4.45 MIL/uL (ref 3.87–5.11)
RDW: 12.2 % (ref 11.5–15.5)
WBC: 5.7 10*3/uL (ref 4.0–10.5)

## 2017-11-05 LAB — HCG, QUANTITATIVE, PREGNANCY: hCG, Beta Chain, Quant, S: 156348 m[IU]/mL — ABNORMAL HIGH (ref <5)

## 2017-11-05 LAB — I-STAT BETA HCG BLOOD, ED (MC, WL, AP ONLY): I-stat hCG, quantitative: >2000 m[IU]/mL — ABNORMAL HIGH (ref <5)

## 2017-11-05 LAB — LIPASE, BLOOD: Lipase: 26 U/L (ref 11–51)

## 2017-11-05 MED ORDER — DOXYLAMINE-PYRIDOXINE 10-10 MG PO TBEC: DELAYED_RELEASE_TABLET | ORAL | 0 refills | Status: DC

## 2017-11-05 NOTE — ED Triage Notes (Signed)
Pt reports recently being treated for vitamin D deficiency and having abd discomfort since then and constipation. Has only had 1 bowel movement in 9 days. Took enema this am with no relief.

## 2017-11-05 NOTE — ED Provider Notes (Signed)
[facility] EMERGENCY DEPARTMENT Provider Note   CSN: [id number] Arrival date & time: 11/05/17  1322     History   Chief Complaint Chief Complaint  Patient presents with  . Abdominal Pain    HPI Samantha Mcpherson is a 26 y.o. female.  HPI Samantha Mcpherson is a 26 y.o. female with history of irritable bowel syndrome, presents to emergency department complaining of nausea, abdominal discomfort, constipation.  Patient states that for the last 2 weeks she has had increased bloating and difficulty having a bowel movement.  She was seen by her family doctor 2 weeks ago and was started on vitamin D.  She believes she could have sensitivity to this medication.  She has called her doctor who recommended starting MiraLAX.  She states that she has tried MiraLAX, Senokot, and has done an enema, and nothing has helped her have a bowel movement.  She states she only had one small bowel movement in the last 9 days.  She also reports some nausea, loss of appetite, has had several episodes of emesis as well.  She reports lower abdominal pain.  Her last menstrual period was 1 month ago.  She states that she has had some spotting.  None at this time.  Past Medical History:  Diagnosis Date  . ADHD (attention deficit hyperactivity disorder)   . Allergy     Patient Active Problem List   Diagnosis Date Noted  . Physical exam 10/19/2017  . Irritable bowel syndrome with diarrhea 08/26/2015    History reviewed. No pertinent surgical history.  OB History    No data available       Home Medications    Prior to Admission medications   Medication Sig Start Date End Date Taking? Authorizing Provider  atomoxetine (STRATTERA) 10 MG capsule Take 10 mg by mouth daily.    [provider]  drospirenone-ethinyl estradiol (LORYNA) 3-0.02 MG tablet TAKE AS DIRECTED 02/15/16   Shelly Bombard, MD  fexofenadine (ALLEGRA) 30 MG tablet Take 30 mg by mouth 2 (two) times daily.     [provider]  Vitamin D, Ergocalciferol, (DRISDOL) 50000 units CAPS capsule Take 1 capsule (50,000 Units total) every 7 (seven) days for 12 doses by mouth. 10/20/17 01/06/18  Midge Minium, MD    Family History Family History  Problem Relation Age of Onset  . Crohn's disease Mother   . Hypertension Mother   . Hyperlipidemia Mother   . Cancer Maternal Grandmother        breast  . Heart disease Maternal Grandfather   . Heart disease Paternal Grandmother   . Stroke Paternal Grandmother   . Diabetes Paternal Grandfather   . Hypertension Paternal Grandfather     Social History Social History   Tobacco Use  . Smoking status: Never Smoker  . Smokeless tobacco: Never Used  Substance Use Topics  . Alcohol use: Yes    Alcohol/week: 0.0 oz    Comment: sometimes  . Drug use: No     Allergies   Amoxicillin   Review of Systems Review of Systems  Constitutional: Negative for chills and fever.  Respiratory: Negative for cough, chest tightness and shortness of breath.   Cardiovascular: Negative for chest pain, palpitations and leg swelling.  Gastrointestinal: Positive for abdominal pain, constipation, nausea and vomiting. Negative for diarrhea.  Genitourinary: Negative for dysuria, flank pain, pelvic pain, vaginal bleeding, vaginal discharge and vaginal pain.  Musculoskeletal: Negative for arthralgias, myalgias, neck pain and neck stiffness.  Skin: Negative for rash.  Neurological: Negative for dizziness, weakness and headaches.  All other systems reviewed and are negative.    Physical Exam Updated Vital Signs BP 114/63 (BP Location: Right Arm)   Pulse 79   Temp 98.2 F (36.8 C) (Oral)   Resp 14   Ht 5\' 1"  (1.549 m)   Wt 59 kg (130 lb)   LMP 10/09/2017   SpO2 99%   BMI 24.56 kg/m   Physical Exam  Constitutional: She appears well-developed and well-nourished. No distress.  HENT:  Head: Normocephalic.  Eyes: Conjunctivae are normal.  Neck: Neck  supple.  Cardiovascular: Normal rate, regular rhythm and normal heart sounds.  Pulmonary/Chest: Effort normal and breath sounds normal. No respiratory distress. She has no wheezes. She has no rales.  Abdominal: Soft. Bowel sounds are normal. She exhibits no distension. There is no tenderness. There is no rebound.  Musculoskeletal: She exhibits no edema.  Neurological: She is alert.  Skin: Skin is warm and dry.  Psychiatric: She has a normal mood and affect. Her behavior is normal.  Nursing note and vitals reviewed.    ED Treatments / Results  Labs (all labs ordered are listed, but only abnormal results are displayed) Labs Reviewed  COMPREHENSIVE METABOLIC PANEL - Abnormal; Notable for the following components:      Result Value   Sodium 133 (*)    Glucose, Bld 111 (*)    ALT 5 (*)    Total Bilirubin 1.3 (*)    All other components within normal limits  I-STAT BETA HCG BLOOD, ED ([facility], [facility], [facility] ONLY) - Abnormal; Notable for the following components:   I-stat hCG, quantitative >2,000.0 (*)    All other components within normal limits  LIPASE, BLOOD  CBC  URINALYSIS, ROUTINE W REFLEX MICROSCOPIC  HCG, QUANTITATIVE, PREGNANCY    EKG  EKG Interpretation None       Radiology US Ob Comp < 14 Wks  Result Date: 11/05/2017 CLINICAL DATA:  Pregnant patient with lower abdominal pain and vaginal spotting for 1 week. EXAM: OBSTETRIC <14 WK [place] AND TRANSVAGINAL OB US TECHNIQUE: Both transabdominal and transvaginal ultrasound examinations were performed for complete evaluation of the gestation as well as the maternal uterus, adnexal regions, and pelvic cul-de-sac. Transvaginal technique was performed to assess early pregnancy. COMPARISON:  None. FINDINGS: Intrauterine gestational sac: Single Yolk sac:  Visualized. Embryo:  Visualized. Cardiac Activity: Visualized. Heart Rate: 131  bpm MSD:   mm    w     d CRL:  7.2  mm   6 w   4 d                  [place] [facility]: June 27, 2018 Subchorionic hemorrhage:   None visualized. Maternal uterus/adnexae: Normal IMPRESSION: There is a single live IUP.  No cause for symptoms identified. Electronically Signed   By: Dorise Bullion III M.D   On: 11/05/2017 16:13   US Ob Transvaginal  Result Date: 11/05/2017 CLINICAL DATA:  Pregnant patient with lower abdominal pain and vaginal spotting for 1 week. EXAM: OBSTETRIC <14 WK [place] AND TRANSVAGINAL OB US TECHNIQUE: Both transabdominal and transvaginal ultrasound examinations were performed for complete evaluation of the gestation as well as the maternal uterus, adnexal regions, and pelvic cul-de-sac. Transvaginal technique was performed to assess early pregnancy. COMPARISON:  None. FINDINGS: Intrauterine gestational sac: Single Yolk sac:  Visualized. Embryo:  Visualized. Cardiac Activity: Visualized. Heart Rate: 131  bpm MSD:   mm  w     d CRL:  7.2  mm   6 w   4 d                  [place] [facility]: June 27, 2018 Subchorionic hemorrhage:  None visualized. Maternal uterus/adnexae: Normal IMPRESSION: There is a single live IUP.  No cause for symptoms identified. Electronically Signed   By: Dorise Bullion III M.D   On: 11/05/2017 16:13    Procedures Procedures (including critical care time)  Medications Ordered in ED Medications - No data to display   Initial Impression / Assessment and Plan / ED Course  I have reviewed the triage vital signs and the nursing notes.  Pertinent labs & imaging results that were available during my care of the patient were reviewed by me and considered in my medical decision making (see chart for details).     Patient with lower abdominal pain, constipation, nausea and vomiting.  Has tried MiraLAX, Senokot, enemas.  Will check labs, pregnancy test, UA.  Patient's pregnancy test is positive.  She states that her last menstrual cycle was just 4 weeks ago.  She did report that she had some spotting and abdominal cramping in the last few days, not any at this present time.  Will get ultrasound for  further evaluation.   Ultrasound unremarkable showing a live intrauterine pregnancy at 6 weeks and 4 days.  Patient reassured, follow-up with GYN, prenatal vitamins, Tylenol, dicleges  for nausea and vomiting.  At this time stable for discharge home.  Vital signs are normal.  Return precautions discussed.  I suspect the patient's bloating, nausea, GI discomfort has been caused by her pregnancy.  I did inform her that she can do another enema if she feels that is needed. Increased oral fluids and fiber. Return precautions discussed.   Vitals:   11/05/17 1330 11/05/17 1631  BP: 114/63 102/60  Pulse: 79 76  Resp: 14 12  Temp: 98.2 F (36.8 C)   TempSrc: Oral   SpO2: 99% 99%  Weight: 59 kg (130 lb)   Height: 5\' 1"  (1.549 m)       Final Clinical Impressions(s) / ED Diagnoses   Final diagnoses:  Nausea and vomiting in pregnancy  Intrauterine pregnancy    ED Discharge Orders        Ordered    Doxylamine-Pyridoxine 10-10 MG TBEC     11/05/17 1630       Marc [facility] [place], PA-C 11/05/17 1753    Virgel Manifold, MD 11/06/17 1028

## 2017-11-05 NOTE — Discharge Instructions (Signed)
Start prenatal vitamins.  Take Tylenol for cramping. Take medication prescribed for nausea and vomiting. Follow up with OB/GYN for further prenatal care.

## 2017-11-13 DIAGNOSIS — N912 Amenorrhea, unspecified: Secondary | ICD-10-CM | POA: Diagnosis not present

## 2018-01-11 ENCOUNTER — Encounter: Payer: Self-pay | Admitting: Family Medicine

## 2018-01-11 DIAGNOSIS — Z6824 Body mass index (BMI) 24.0-24.9, adult: Secondary | ICD-10-CM | POA: Diagnosis not present

## 2018-01-11 DIAGNOSIS — Z01419 Encounter for gynecological examination (general) (routine) without abnormal findings: Secondary | ICD-10-CM | POA: Diagnosis not present

## 2018-01-11 LAB — HM PAP SMEAR

## 2018-01-29 ENCOUNTER — Emergency Department (HOSPITAL_COMMUNITY)
Admission: EM | Admit: 2018-01-29 | Discharge: 2018-01-29 | Disposition: A | Discharge status: Home / Self Care | Payer: 59 | Attending: Emergency Medicine | Admitting: Emergency Medicine

## 2018-01-29 ENCOUNTER — Other Ambulatory Visit: Payer: Self-pay

## 2018-01-29 ENCOUNTER — Encounter (HOSPITAL_COMMUNITY): Payer: Self-pay

## 2018-01-29 DIAGNOSIS — S199XXA Unspecified injury of neck, initial encounter: Secondary | ICD-10-CM | POA: Diagnosis present

## 2018-01-29 DIAGNOSIS — Y92410 Unspecified street and highway as the place of occurrence of the external cause: Secondary | ICD-10-CM | POA: Insufficient documentation

## 2018-01-29 DIAGNOSIS — Y999 Unspecified external cause status: Secondary | ICD-10-CM | POA: Diagnosis not present

## 2018-01-29 DIAGNOSIS — F909 Attention-deficit hyperactivity disorder, unspecified type: Secondary | ICD-10-CM | POA: Diagnosis not present

## 2018-01-29 DIAGNOSIS — Z79899 Other long term (current) drug therapy: Secondary | ICD-10-CM | POA: Diagnosis not present

## 2018-01-29 DIAGNOSIS — S161XXA Strain of muscle, fascia and tendon at neck level, initial encounter: Secondary | ICD-10-CM | POA: Insufficient documentation

## 2018-01-29 DIAGNOSIS — Y9389 Activity, other specified: Secondary | ICD-10-CM | POA: Diagnosis not present
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
## 2018-01-29 NOTE — Discharge Instructions (Signed)
Please read attached information. If you experience any new or worsening signs or symptoms please return to the emergency room for evaluation. Please follow-up with your primary care provider or specialist as discussed.  °
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
## 2018-04-27 ENCOUNTER — Encounter: Payer: Self-pay | Admitting: General Practice

## 2018-10-09 ENCOUNTER — Ambulatory Visit (HOSPITAL_COMMUNITY)
Admission: EM | Admit: 2018-10-09 | Discharge: 2018-10-09 | Disposition: A | Discharge status: Home / Self Care | Payer: 59 | Attending: Family Medicine | Admitting: Family Medicine

## 2018-10-09 ENCOUNTER — Encounter (HOSPITAL_COMMUNITY): Payer: Self-pay | Admitting: Emergency Medicine

## 2018-10-09 DIAGNOSIS — R51 Headache: Secondary | ICD-10-CM | POA: Diagnosis not present

## 2018-10-09 DIAGNOSIS — R519 Headache, unspecified: Secondary | ICD-10-CM

## 2018-10-09 MED ORDER — BUTALBITAL-APAP-CAFFEINE 50-325-40 MG PO TABS: 1.0000 | ORAL_TABLET | Freq: Four times a day (QID) | ORAL | 0 refills | Status: AC | PRN

## 2018-10-09 MED ORDER — FLUTICASONE PROPIONATE 50 MCG/ACT NA SUSP: 1.0000 | Freq: Every day | NASAL | 2 refills | Status: DC

## 2018-10-09 NOTE — ED Triage Notes (Signed)
Pt sts HA x 3 days and ear pain this am

## 2018-10-09 NOTE — Discharge Instructions (Addendum)
Drink plenty of fluids Use the Flonase twice a day until symptoms improve Take Tylenol, ibuprofen, or Excedrin for usual headaches When headache is severe take the Fioricet prescribed for you Caution this can cause mild drowsiness.  Do not take and drive See your PCP if headaches persist

## 2018-10-09 NOTE — ED Provider Notes (Signed)
[place]    CSN: [id number] Arrival date & time: 10/09/18  1110     History   Chief Complaint Chief Complaint  Patient presents with  . Headache  . Otalgia    HPI Samantha Mcpherson is a 27 y.o. female.   HPI  Patient is here today for headache.  She is had a headache for 3 days.  Is progressively getting worse.  It is in the front of her face cheeks and behind her eyes.  Is worse when she leans forward.  This morning she got up she has also had ear pressure and pain.  No sore throat.  No fever.  No postnasal drip or sinus drainage.  Mild allergies in the past for which she takes over-the-counter medications.  No history of migraines.  No visual symptoms.  No nausea.  No trauma.  Past Medical History:  Diagnosis Date  . ADHD (attention deficit hyperactivity disorder)   . Allergy     Patient Active Problem List   Diagnosis Date Noted  . Physical exam 10/19/2017  . Irritable bowel syndrome with diarrhea 08/26/2015    History reviewed. No pertinent surgical history.  OB History   None      Home Medications    Prior to Admission medications   Medication Sig Start Date End Date Taking? Authorizing Provider  atomoxetine (STRATTERA) 10 MG capsule Take 10 mg by mouth daily.    [provider]  butalbital-acetaminophen-caffeine (FIORICET, ESGIC) 50-325-40 MG tablet Take 1-2 tablets by mouth every 6 (six) hours as needed for headache. 10/09/18 10/09/19  Raylene Everts, MD  drospirenone-ethinyl estradiol (LORYNA) 3-0.02 MG tablet TAKE AS DIRECTED 02/15/16   Shelly Bombard, MD  fexofenadine (ALLEGRA) 30 MG tablet Take 30 mg by mouth 2 (two) times daily.    [provider]  fluticasone (FLONASE) 50 MCG/ACT nasal spray Place 1 spray into both nostrils daily. 10/09/18   Raylene Everts, MD    Family History Family History  Problem Relation Age of Onset  . Crohn's disease Mother   . Hypertension Mother   . Hyperlipidemia Mother   .  Cancer Maternal Grandmother        breast  . Heart disease Maternal Grandfather   . Heart disease Paternal Grandmother   . Stroke Paternal Grandmother   . Diabetes Paternal Grandfather   . Hypertension Paternal Grandfather     Social History Social History   Tobacco Use  . Smoking status: Never Smoker  . Smokeless tobacco: Never Used  Substance Use Topics  . Alcohol use: Yes    Alcohol/week: 0.0 standard drinks    Comment: sometimes  . Drug use: No     Allergies   Amoxicillin   Review of Systems Review of Systems  Constitutional: Negative for chills and fever.  HENT: Positive for congestion, sinus pressure and sinus pain. Negative for ear pain, postnasal drip, rhinorrhea and sore throat.   Eyes: Negative for pain and visual disturbance.  Respiratory: Negative for cough and shortness of breath.   Cardiovascular: Negative for chest pain and palpitations.  Gastrointestinal: Negative for abdominal pain and vomiting.  Genitourinary: Negative for dysuria and hematuria.  Musculoskeletal: Positive for neck stiffness. Negative for arthralgias and back pain.  Skin: Negative for color change and rash.  Neurological: Positive for headaches. Negative for dizziness, seizures and syncope.  Psychiatric/Behavioral:       Endorses some stress at work.  Manageable  All other systems reviewed and are negative.  Physical Exam Triage Vital Signs ED Triage Vitals [10/09/18 1218]  Enc Vitals Group     BP 121/76     Pulse Rate 66     Resp 18     Temp 98 F (36.7 C)     Temp Source Oral     SpO2 99 %     Weight      Height      Head Circumference      Peak Flow      Pain Score 9     Pain Loc      Pain Edu?      Excl. in [place]?    No data found.  Updated Vital Signs BP 121/76 (BP Location: Right Arm)   Pulse 66   Temp 98 F (36.7 C) (Oral)   Resp 18   SpO2 99%   Physical Exam  Constitutional: She is oriented to person, place, and time. She appears well-developed and  well-nourished. No distress.  HENT:  Head: Normocephalic and atraumatic.  Mouth/Throat: Oropharynx is clear and moist.  Tenderness over maxillary sinuses.  Nasal membranes swollen, pink  Eyes: Pupils are equal, round, and reactive to light. Conjunctivae and EOM are normal.  Disks flat  Neck: Normal range of motion. Neck supple.  Mild tenderness of neck and upper back muscles, upper body of trapezius.  No spasm.  Full motion  Cardiovascular: Normal rate, regular rhythm and normal heart sounds.  Pulmonary/Chest: Effort normal and breath sounds normal. No respiratory distress.  Abdominal: Soft. She exhibits no distension.  Musculoskeletal: Normal range of motion. She exhibits no edema.  Neurological: She is alert and oriented to person, place, and time. She has normal strength. She displays normal reflexes. Coordination and gait normal.  Skin: Skin is warm and dry.  Psychiatric: She has a normal mood and affect. Her behavior is normal.     UC Treatments / Results  Labs (all labs ordered are listed, but only abnormal results are displayed) Labs Reviewed - No data to display  EKG None  Radiology No results found.  Procedures Procedures (including critical care time)  Medications Ordered in UC Medications - No data to display  Initial Impression / Assessment and Plan / UC Course  I have reviewed the triage vital signs and the nursing notes.  Pertinent labs & imaging results that were available during my care of the patient were reviewed by me and considered in my medical decision making (see chart for details).     Discussed that the most common causes of headache include migraine, sinus, tension.  She does have some muscular tension and increased stress.  In addition though she has sinus tenderness, nasal congestion, and pressure with forward leaning.  I believe at this time she has sinus congestion causing most of her symptoms.  No history of or evidence of migraine.  Exam is  reassuring Final Clinical Impressions(s) / UC Diagnoses   Final diagnoses:  Sinus headache     Discharge Instructions     Drink plenty of fluids Use the Flonase twice a day until symptoms improve Take Tylenol, ibuprofen, or Excedrin for usual headaches When headache is severe take the Fioricet prescribed for you Caution this can cause mild drowsiness.  Do not take and drive See your PCP if headaches persist   ED Prescriptions    Medication Sig Dispense Auth. Provider   butalbital-acetaminophen-caffeine (FIORICET, ESGIC) 50-325-40 MG tablet Take 1-2 tablets by mouth every 6 (six) hours as needed for headache. [place]  tablet Raylene Everts, MD   fluticasone [facility]) 50 MCG/ACT nasal spray Place 1 spray into both nostrils daily. 16 g Raylene Everts, MD     Controlled Substance Prescriptions [place] Controlled Substance Registry consulted? Not Applicable   Raylene Everts, MD 10/09/18 [phone]

## 2018-12-18 IMAGING — US US OB COMP LESS 14 WK
1 series · 14 of 28 positions shown · non-contrast
Comparison: None.

CLINICAL DATA: Pregnant patient with lower abdominal pain and
vaginal spotting for 1 week.

EXAM:
OBSTETRIC <14 WK US AND TRANSVAGINAL OB US
TECHNIQUE: Both transabdominal and transvaginal ultrasound examinations were
performed for complete evaluation of the gestation as well as the
maternal uterus, adnexal regions, and pelvic cul-de-sac.
Transvaginal technique was performed to assess early pregnancy.

[Series 1: us ob comp less 14 wk · 0.13mm/px · 72 acquisitions, 14 frames shown]
[im 3/72]
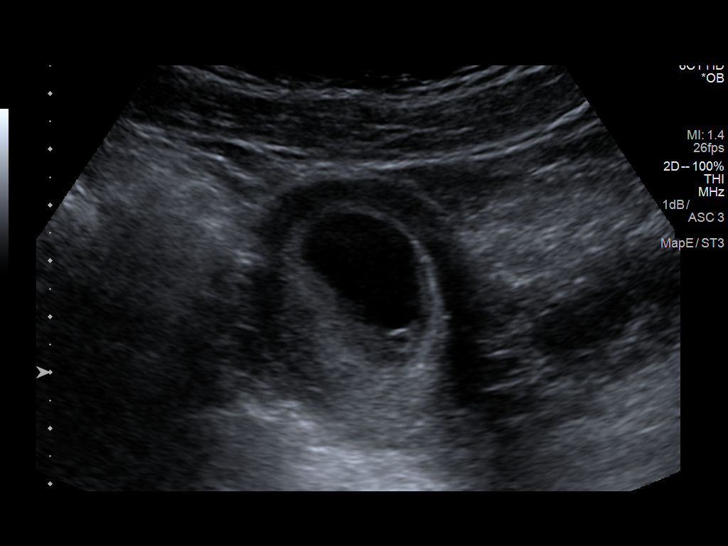
[im 8/72]
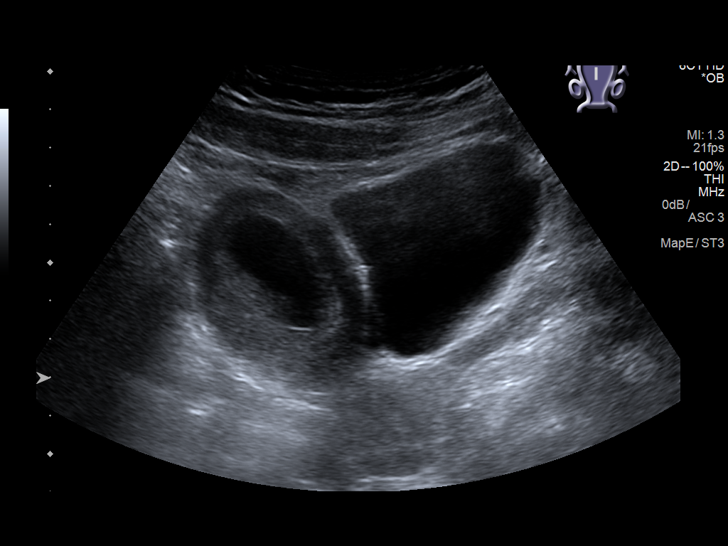
[im 14/72]
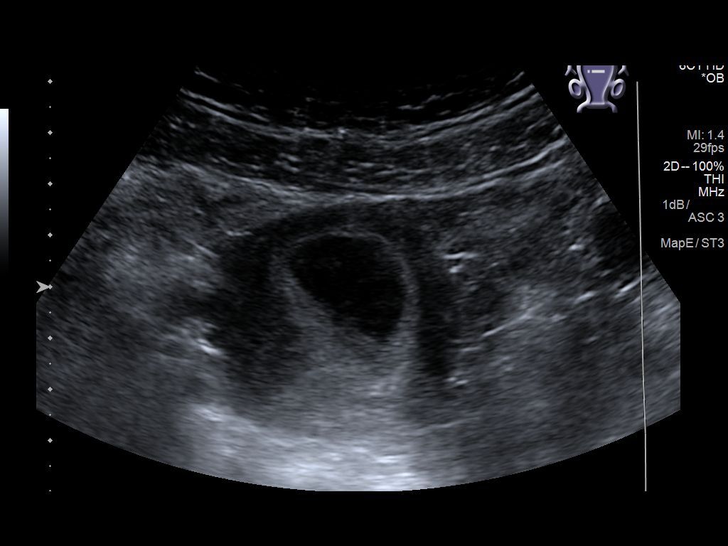
[im 19/72]
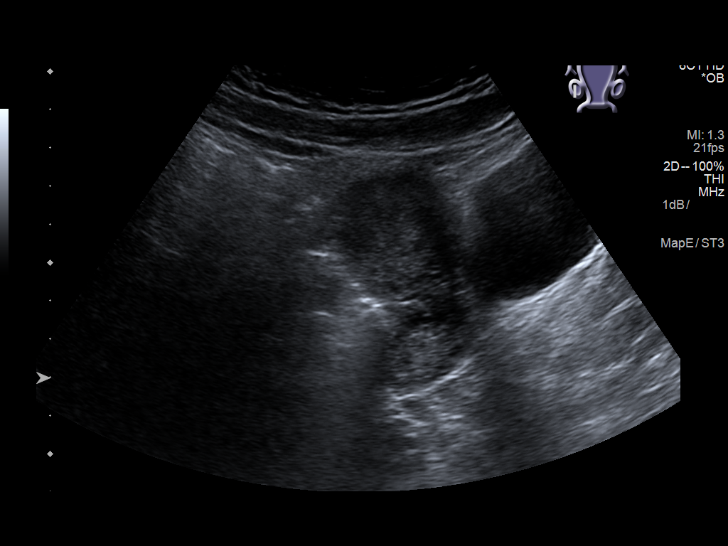
[im 24/72]
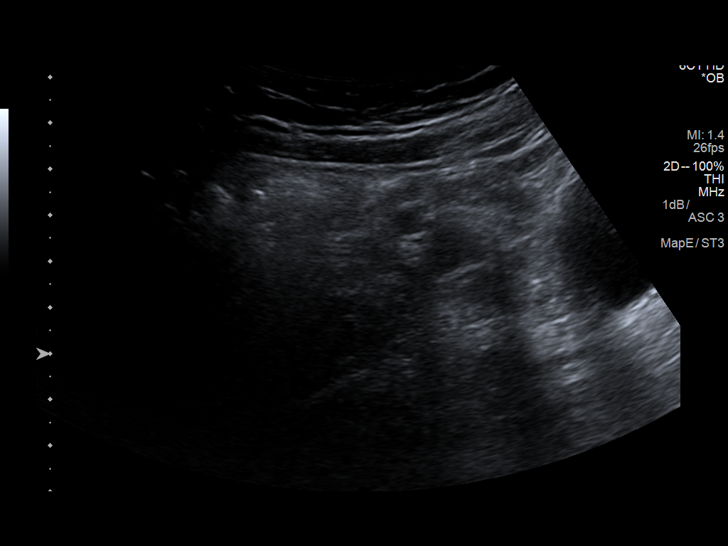
[im 29/72]
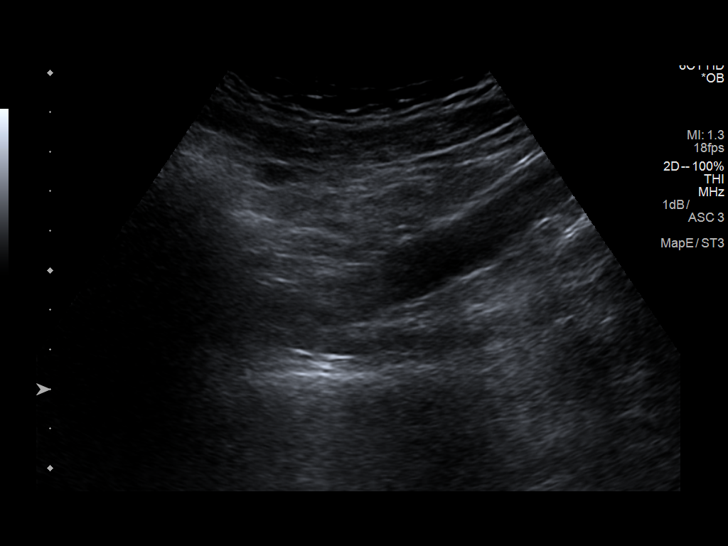
[im 35/72]
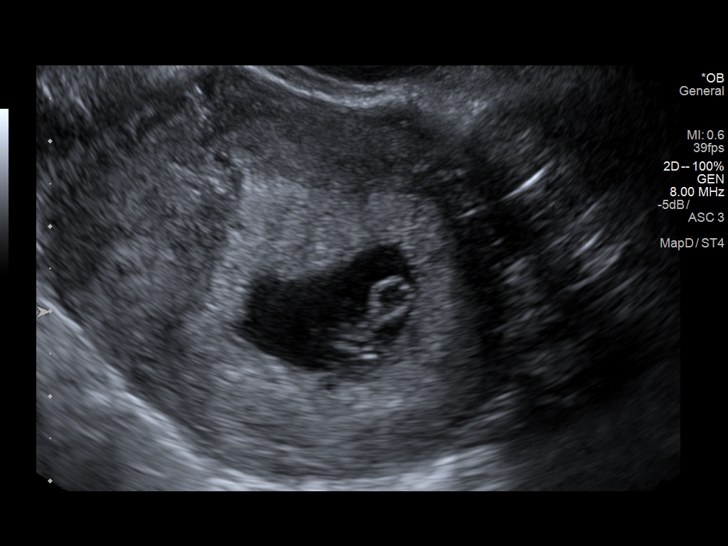
[im 40/72]
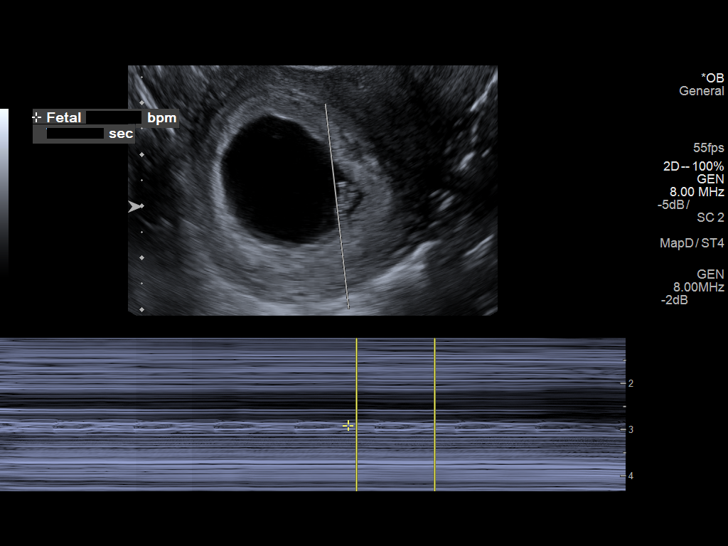
[im 45/72]
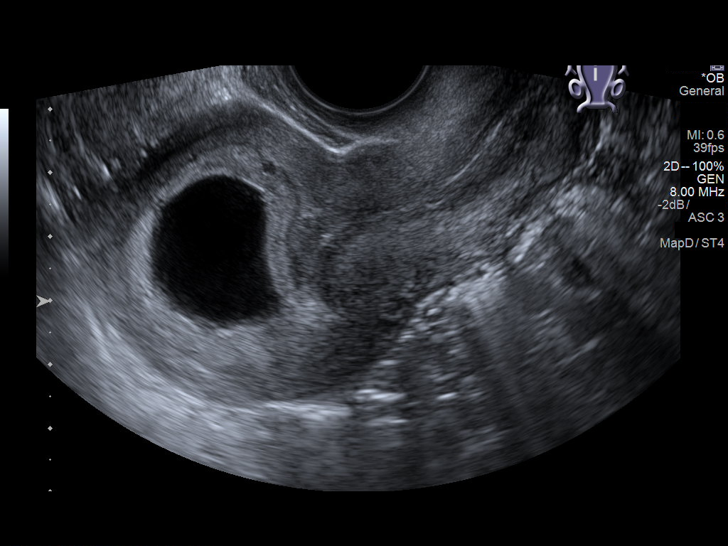
[im 50/72]
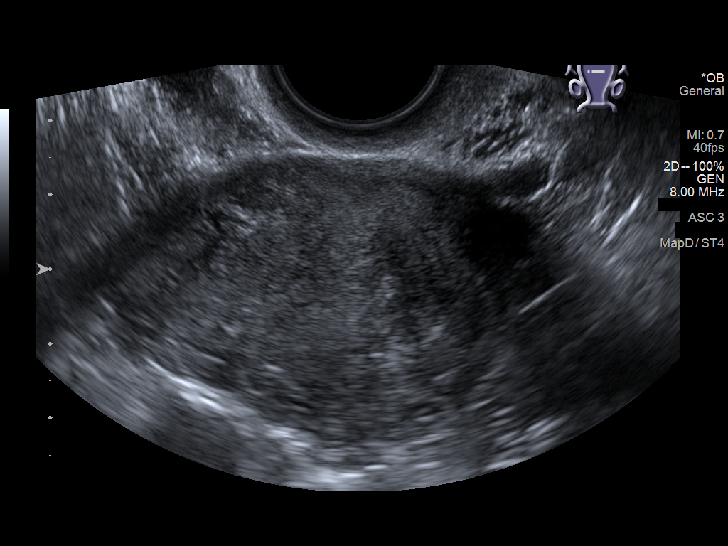
[im 56/72]
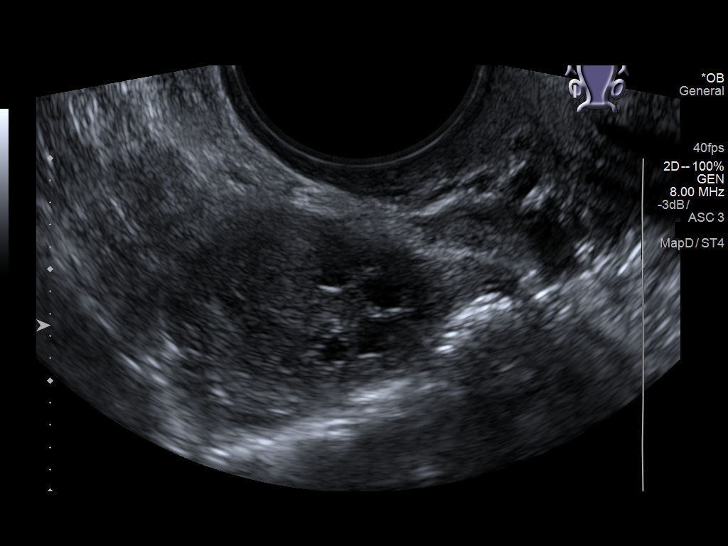
[im 61/72]
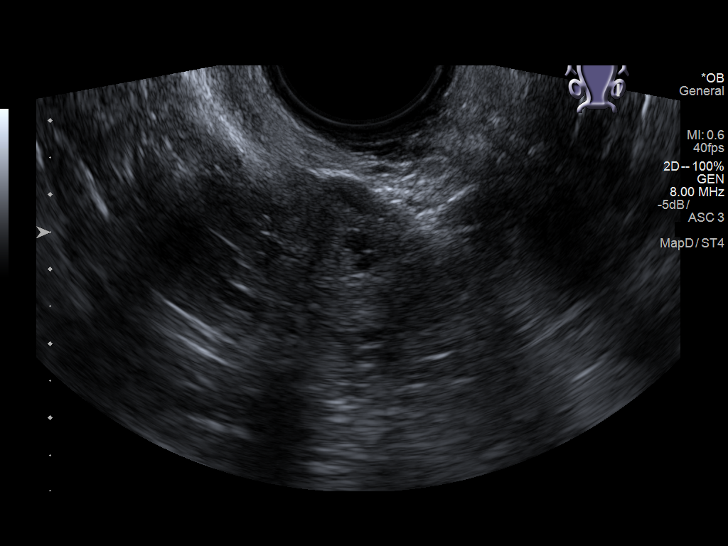
[im 66/72]
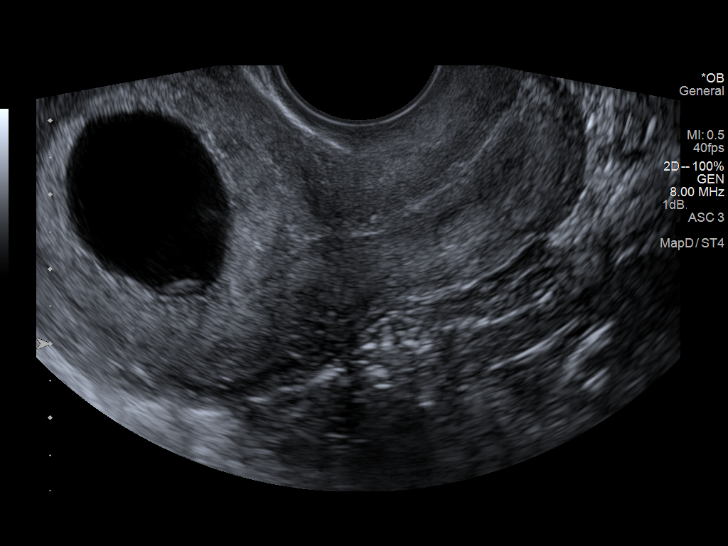
[im 72/72]
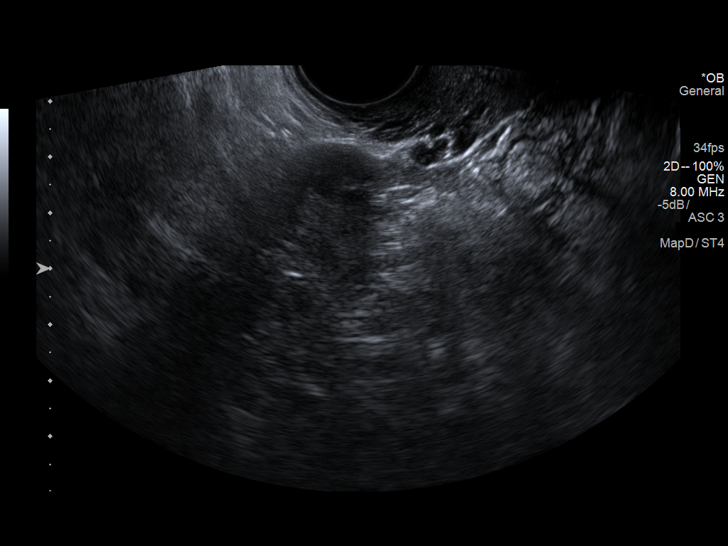

[14 of 28 positions shown; findings below may reference images not displayed]

FINDINGS: Intrauterine gestational sac: Single

Yolk sac:  Visualized.

Embryo:  Visualized.

Cardiac Activity: Visualized.

Heart Rate: 131  bpm

MSD:   mm    w     d

CRL:  7.2  mm   6 w   4 d                  US EDC: June 27, 2018

Subchorionic hemorrhage:  None visualized.

Maternal uterus/adnexae: Normal
IMPRESSION: There is a single live IUP.  No cause for symptoms identified.

## 2019-05-09 ENCOUNTER — Telehealth: Payer: 59 | Admitting: Physician Assistant

## 2019-05-09 DIAGNOSIS — J019 Acute sinusitis, unspecified: Secondary | ICD-10-CM | POA: Diagnosis not present

## 2019-05-09 DIAGNOSIS — B9789 Other viral agents as the cause of diseases classified elsewhere: Secondary | ICD-10-CM | POA: Diagnosis not present

## 2019-05-09 MED ORDER — IPRATROPIUM BROMIDE 0.03 % NA SOLN: 2.0000 | Freq: Two times a day (BID) | NASAL | 0 refills | Status: DC

## 2019-05-09 NOTE — Progress Notes (Signed)

## 2019-05-09 NOTE — Progress Notes (Signed)
I have spent 5 minutes in review of e-visit questionnaire, review and updating patient chart, medical decision making and response to patient.   Noma Quijas Cody Julyan Gales, PA-C    

## 2019-05-13 DIAGNOSIS — G43909 Migraine, unspecified, not intractable, without status migrainosus: Secondary | ICD-10-CM | POA: Insufficient documentation

## 2019-05-30 ENCOUNTER — Telehealth: Payer: Self-pay | Admitting: Family Medicine

## 2019-05-30 NOTE — Telephone Encounter (Signed)
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 The cook was there last Friday and didn't feel well. Was tested for COVID and she was positive.  They have worn masks and she has not had any "direct" contact with the cook. She is asymptomatic, but is asking if she should be tested.  Her mother would feel better if so due to the fact that she is around her grandparents - but if PCP thinks it is completely unnecessary she will not worry about it.   Routing to PCP to see if she feels like patient needs a VV or if she should be fine without testing.

## 2019-05-30 NOTE — Telephone Encounter (Signed)
Since pt has known exposure we can route to [facility] pool for COVID testing

## 2019-05-30 NOTE — Telephone Encounter (Signed)
PEC testing message placed and pt made aware.

## 2019-05-30 NOTE — Telephone Encounter (Signed)
Pt reports possible covid 19 exposure.. States asymptomatic.  States works at [employer] where employee tested positive, resulted yesterday. Pt exposure was this past Friday. 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 Requesting covid testing.  Please advise, send referral for testing if appropriate. Pts CB# [phone]

## 2019-05-31 ENCOUNTER — Other Ambulatory Visit: Payer: Self-pay

## 2019-05-31 ENCOUNTER — Telehealth: Payer: Self-pay | Admitting: *Deleted

## 2019-05-31 DIAGNOSIS — Z20822 Contact with and (suspected) exposure to covid-19: Secondary | ICD-10-CM

## 2019-05-31 NOTE — Telephone Encounter (Signed)
Contacted pt to schedule COVID testing per Dr Birdie Riddle; pt offered and accepted appointment at [facility] site 05/31/2019 at 0830; pt given address, location, and instructions that she and all occupants of her vehicle should wear masks; she verbalized understanding; orders placed per protocol. Davis Gourd, CMA  [facility] [employer]        MRN: [id number]  Pt Name: Samantha Mcpherson  DOB: [birth year]  Reason for Test: positive exposure  Insurance: [place] #: [id number]  Eligibility: Valid    Test ordered per PCP recommendations due to positive exposure at work.

## 2019-06-02 ENCOUNTER — Other Ambulatory Visit: Payer: Self-pay | Admitting: Physician Assistant

## 2019-06-04 LAB — NOVEL CORONAVIRUS, NAA: SARS-CoV-2, NAA: NOT DETECTED

## 2019-06-05 ENCOUNTER — Other Ambulatory Visit: Payer: 59

## 2021-03-11 ENCOUNTER — Other Ambulatory Visit: Payer: Self-pay

## 2021-03-11 ENCOUNTER — Encounter: Payer: Self-pay | Admitting: Family Medicine

## 2021-03-11 ENCOUNTER — Ambulatory Visit (INDEPENDENT_AMBULATORY_CARE_PROVIDER_SITE_OTHER): Payer: 59 | Admitting: Family Medicine

## 2021-03-11 VITALS — BP 117/70 | HR 74 | Temp 98.7°F | Resp 20 | Ht 61.5 in | Wt 135.0 lb

## 2021-03-11 DIAGNOSIS — Z1159 Encounter for screening for other viral diseases: Secondary | ICD-10-CM | POA: Diagnosis not present

## 2021-03-11 DIAGNOSIS — Z23 Encounter for immunization: Secondary | ICD-10-CM | POA: Diagnosis not present

## 2021-03-11 DIAGNOSIS — G43009 Migraine without aura, not intractable, without status migrainosus: Secondary | ICD-10-CM | POA: Diagnosis not present

## 2021-03-11 DIAGNOSIS — Z0000 Encounter for general adult medical examination without abnormal findings: Secondary | ICD-10-CM | POA: Diagnosis not present

## 2021-03-11 DIAGNOSIS — E663 Overweight: Secondary | ICD-10-CM

## 2021-03-11 DIAGNOSIS — Z114 Encounter for screening for human immunodeficiency virus [HIV]: Secondary | ICD-10-CM | POA: Diagnosis not present

## 2021-03-11 LAB — CBC WITH DIFFERENTIAL/PLATELET
Basophils Absolute: 0 10*3/uL (ref 0.0–0.1)
Basophils Relative: 0.7 % (ref 0.0–3.0)
Eosinophils Absolute: 0 10*3/uL (ref 0.0–0.7)
Eosinophils Relative: 0.5 % (ref 0.0–5.0)
HCT: 40 % (ref 36.0–46.0)
Hemoglobin: 14.1 g/dL (ref 12.0–15.0)
Lymphocytes Relative: 30.6 % (ref 12.0–46.0)
Lymphs Abs: 1.7 10*3/uL (ref 0.7–4.0)
MCHC: 35.2 g/dL (ref 30.0–36.0)
MCV: 87.8 fl (ref 78.0–100.0)
Monocytes Absolute: 0.4 10*3/uL (ref 0.1–1.0)
Monocytes Relative: 7 % (ref 3.0–12.0)
Neutro Abs: 3.4 10*3/uL (ref 1.4–7.7)
Neutrophils Relative %: 61.2 % (ref 43.0–77.0)
Platelets: 263 10*3/uL (ref 150.0–400.0)
RBC: 4.55 Mil/uL (ref 3.87–5.11)
RDW: 12.1 % (ref 11.5–15.5)
WBC: 5.6 10*3/uL (ref 4.0–10.5)

## 2021-03-11 LAB — BASIC METABOLIC PANEL
BUN: 14 mg/dL (ref 6–23)
CO2: 24 mEq/L (ref 19–32)
Calcium: 9.8 mg/dL (ref 8.4–10.5)
Chloride: 104 mEq/L (ref 96–112)
Creatinine, Ser: 0.67 mg/dL (ref 0.40–1.20)
GFR: 118.04 mL/min (ref >60.00)
Glucose, Bld: 81 mg/dL (ref 70–99)
Potassium: 4 mEq/L (ref 3.5–5.1)
Sodium: 136 mEq/L (ref 135–145)

## 2021-03-11 LAB — HEPATIC FUNCTION PANEL
ALT: 26 U/L (ref 0–35)
AST: 23 U/L (ref 0–37)
Albumin: 4.5 g/dL (ref 3.5–5.2)
Alkaline Phosphatase: 58 U/L (ref 39–117)
Bilirubin, Direct: 0.1 mg/dL (ref 0.0–0.3)
Total Bilirubin: 0.4 mg/dL (ref 0.2–1.2)
Total Protein: 7.4 g/dL (ref 6.0–8.3)

## 2021-03-11 LAB — LIPID PANEL
Cholesterol: 173 mg/dL (ref 0–200)
HDL: 51.6 mg/dL (ref >39.00)
LDL Cholesterol: 108 mg/dL — ABNORMAL HIGH (ref 0–99)
NonHDL: 121.75
Total CHOL/HDL Ratio: 3
Triglycerides: 71 mg/dL (ref 0.0–149.0)
VLDL: 14.2 mg/dL (ref 0.0–40.0)

## 2021-03-11 LAB — TSH: TSH: 1.68 u[IU]/mL (ref 0.35–4.50)

## 2021-03-11 MED ORDER — RIZATRIPTAN BENZOATE 5 MG PO TABS: 5.0000 mg | ORAL_TABLET | ORAL | 6 refills | Status: DC | PRN

## 2021-03-11 NOTE — Assessment & Plan Note (Signed)
New.  Pt attempts to take OTC pain relievers at onset of HAs but if she is not able to or 'misses the window' HA will progress to full blown migraine.  Will give low dose Maxalt as an abortive.  Reviewed when and how to take.  Pt expressed understanding and is in agreement w/ plan.

## 2021-03-11 NOTE — Patient Instructions (Signed)
Follow up in 1 year or as needed We'll notify you of your lab results and make any changes if needed Keep up the good work on healthy diet and regular exercise- you look great! Use the Rizatriptan (Maxalt) as needed for migraines Call with any questions or concerns Stay Safe!  Stay Healthy! Happy Spring!

## 2021-03-11 NOTE — Assessment & Plan Note (Signed)
Pt's PE WNL.  Tdap updated.  Has pap upcoming.  UTD on COVID vaccines.  Check labs.  Anticipatory guidance provided.

## 2021-03-11 NOTE — Progress Notes (Signed)
   Subjective:    Patient ID: Samantha Mcpherson, female    DOB: November 22, 1991, 30 y.o.   MRN: [id number]  HPI CPE- UTD on COVID.  Due for Tdap.  Pt had pap last year w/ Dr Lynnette Caffey  Reviewed past medical, surgical, family and social histories.   Health Maintenance  Topic Date Due  . HIV Screening  Never done  . TETANUS/TDAP  10/14/2014  . INFLUENZA VACCINE  07/12/2020  . PAP-Cervical Cytology Screening  08/09/2021 (Originally 01/11/2021)  . PAP SMEAR-Modifier  08/09/2021 (Originally 01/11/2021)  . Hepatitis C Screening  03/11/2022 (Originally 1991/05/04)  . HPV VACCINES  Aged Out    Patient Care Team    Relationship Specialty Notifications Start End  Midge Minium, MD PCP - General Family Medicine  02/18/21   Linda Hedges, DO Consulting Physician Obstetrics and Gynecology  03/11/21      Review of Systems Patient reports no vision/ hearing changes, adenopathy,fever, weight change,  persistant/recurrent hoarseness , swallowing issues, chest pain, palpitations, edema, persistant/recurrent cough, hemoptysis, dyspnea (rest/exertional/paroxysmal nocturnal), gastrointestinal bleeding (melena, rectal bleeding), abdominal pain, significant heartburn, bowel changes, GU symptoms (dysuria, hematuria, incontinence), Gyn symptoms (abnormal  bleeding, pain),  syncope, focal weakness, memory loss, numbness & tingling, skin/hair/nail changes, abnormal bruising or bleeding, anxiety, or depression.   + hormonal migraines- at times will be severe and OTC meds won't help.  During these times, she has to lie down and 'sleep it off'.  Occasional nausea, no preceding aura but if HA is severe will note visual changes.  This visit occurred during the SARS-CoV-2 public health emergency.  Safety protocols were in place, including screening questions prior to the visit, additional usage of staff PPE, and extensive cleaning of exam room while observing appropriate contact time as indicated for disinfecting solutions.        Objective:   Physical Exam General Appearance:    Alert, cooperative, no distress, appears stated age  Head:    Normocephalic, without obvious abnormality, atraumatic  Eyes:    PERRL, conjunctiva/corneas clear, EOM's intact, fundi    benign, both eyes  Ears:    Normal TM's and external ear canals, both ears  Nose:   Deferred due to COVID  Throat:   Neck:   Supple, symmetrical, trachea midline, no adenopathy;    Thyroid: no enlargement/tenderness/nodules  Back:     Symmetric, no curvature, ROM normal, no CVA tenderness  Lungs:     Clear to auscultation bilaterally, respirations unlabored  Chest Wall:    No tenderness or deformity   Heart:    Regular rate and rhythm, S1 and S2 normal, no murmur, rub   or gallop  Breast Exam:    Deferred to GYN  Abdomen:     Soft, non-tender, bowel sounds active all four quadrants,    no masses, no organomegaly  Genitalia:    Deferred to GYN  Rectal:    Extremities:   Extremities normal, atraumatic, no cyanosis or edema  Pulses:   2+ and symmetric all extremities  Skin:   Skin color, texture, turgor normal, no rashes or lesions  Lymph nodes:   Cervical, supraclavicular, and axillary nodes normal  Neurologic:   CNII-XII intact, normal strength, sensation and reflexes    throughout          Assessment & Plan:

## 2021-03-12 LAB — HEPATITIS C ANTIBODY
Hepatitis C Ab: NONREACTIVE
SIGNAL TO CUT-OFF: 0.01 (ref <1.00)

## 2021-03-12 LAB — HIV ANTIBODY (ROUTINE TESTING W REFLEX): HIV 1&2 Ab, 4th Generation: NONREACTIVE

## 2021-06-09 ENCOUNTER — Encounter: Payer: Self-pay | Admitting: *Deleted

## 2021-07-22 DIAGNOSIS — Z01419 Encounter for gynecological examination (general) (routine) without abnormal findings: Secondary | ICD-10-CM | POA: Diagnosis not present

## 2021-07-22 DIAGNOSIS — F419 Anxiety disorder, unspecified: Secondary | ICD-10-CM | POA: Diagnosis not present

## 2021-07-22 DIAGNOSIS — Z6824 Body mass index (BMI) 24.0-24.9, adult: Secondary | ICD-10-CM | POA: Diagnosis not present

## 2021-07-22 LAB — HM PAP SMEAR

## 2022-03-28 ENCOUNTER — Ambulatory Visit (INDEPENDENT_AMBULATORY_CARE_PROVIDER_SITE_OTHER): Payer: BC Managed Care – PPO | Admitting: Family Medicine

## 2022-03-28 ENCOUNTER — Encounter: Payer: Self-pay | Admitting: Family Medicine

## 2022-03-28 VITALS — BP 118/70 | HR 96 | Temp 98.3°F | Resp 16 | Ht 61.5 in | Wt 145.6 lb

## 2022-03-28 DIAGNOSIS — E663 Overweight: Secondary | ICD-10-CM | POA: Diagnosis not present

## 2022-03-28 DIAGNOSIS — Z0000 Encounter for general adult medical examination without abnormal findings: Secondary | ICD-10-CM | POA: Diagnosis not present

## 2022-03-28 LAB — CBC WITH DIFFERENTIAL/PLATELET
Basophils Absolute: 0 10*3/uL (ref 0.0–0.1)
Basophils Relative: 0.7 % (ref 0.0–3.0)
Eosinophils Absolute: 0 10*3/uL (ref 0.0–0.7)
Eosinophils Relative: 0.7 % (ref 0.0–5.0)
HCT: 39.7 % (ref 36.0–46.0)
Hemoglobin: 13.7 g/dL (ref 12.0–15.0)
Lymphocytes Relative: 30 % (ref 12.0–46.0)
Lymphs Abs: 1.9 10*3/uL (ref 0.7–4.0)
MCHC: 34.6 g/dL (ref 30.0–36.0)
MCV: 88.3 fl (ref 78.0–100.0)
Monocytes Absolute: 0.2 10*3/uL (ref 0.1–1.0)
Monocytes Relative: 3.8 % (ref 3.0–12.0)
Neutro Abs: 4.1 10*3/uL (ref 1.4–7.7)
Neutrophils Relative %: 64.8 % (ref 43.0–77.0)
Platelets: 277 10*3/uL (ref 150.0–400.0)
RBC: 4.49 Mil/uL (ref 3.87–5.11)
RDW: 12.2 % (ref 11.5–15.5)
WBC: 6.3 10*3/uL (ref 4.0–10.5)

## 2022-03-28 LAB — LIPID PANEL
Cholesterol: 238 mg/dL — ABNORMAL HIGH (ref 0–200)
HDL: 68.3 mg/dL (ref >39.00)
NonHDL: 169.67
Total CHOL/HDL Ratio: 3
Triglycerides: 314 mg/dL — ABNORMAL HIGH (ref 0.0–149.0)
VLDL: 62.8 mg/dL — ABNORMAL HIGH (ref 0.0–40.0)

## 2022-03-28 LAB — HEPATIC FUNCTION PANEL
ALT: 25 U/L (ref 0–35)
AST: 20 U/L (ref 0–37)
Albumin: 4.1 g/dL (ref 3.5–5.2)
Alkaline Phosphatase: 53 U/L (ref 39–117)
Bilirubin, Direct: 0 mg/dL (ref 0.0–0.3)
Total Bilirubin: 0.4 mg/dL (ref 0.2–1.2)
Total Protein: 7 g/dL (ref 6.0–8.3)

## 2022-03-28 LAB — TSH: TSH: 1.47 u[IU]/mL (ref 0.35–5.50)

## 2022-03-28 LAB — LDL CHOLESTEROL, DIRECT: Direct LDL: 133 mg/dL

## 2022-03-28 LAB — BASIC METABOLIC PANEL
BUN: 16 mg/dL (ref 6–23)
CO2: 24 mEq/L (ref 19–32)
Calcium: 9.5 mg/dL (ref 8.4–10.5)
Chloride: 100 mEq/L (ref 96–112)
Creatinine, Ser: 0.76 mg/dL (ref 0.40–1.20)
GFR: 105.05 mL/min (ref >60.00)
Glucose, Bld: 120 mg/dL — ABNORMAL HIGH (ref 70–99)
Potassium: 3.8 mEq/L (ref 3.5–5.1)
Sodium: 135 mEq/L (ref 135–145)

## 2022-03-28 MED ORDER — RIZATRIPTAN BENZOATE 5 MG PO TABS: 5.0000 mg | ORAL_TABLET | ORAL | 3 refills | Status: DC | PRN

## 2022-03-28 NOTE — Patient Instructions (Signed)
Follow up in 1 year or as needed ?We'll notify you of your lab results and make any changes if needed ?Keep up the good work on healthy diet and regular exercise- you can do it!!! ?Make sure you are drinking lots of water, increase your salt intake, and change positions slowly!! ?Call with any questions or concerns ?Stay Safe!  Stay Healthy! ?Happy Spring!!! ?

## 2022-03-28 NOTE — Progress Notes (Signed)
? ?  Subjective:  ? ? Patient ID: Samantha Mcpherson, female    DOB: 24-Mar-1991, 31 y.o.   MRN: [id number] ? ?HPI ?CPE- UTD on pap (done w/ Dr Lynnette Caffey 2022), UTD on Tdap ? ?Patient Care Team  ?  Relationship Specialty Notifications Start End  ?Midge Minium, MD PCP - General Family Medicine  02/18/21   ?Linda Hedges, DO Consulting Physician Obstetrics and Gynecology  03/11/21   ?  ?Health Maintenance  ?Topic Date Due  ? COVID-19 Vaccine (3 - Booster for Pfizer series) 10/06/2020  ? PAP SMEAR-Modifier  01/11/2021  ? INFLUENZA VACCINE  07/12/2022  ? TETANUS/TDAP  03/12/2031  ? Hepatitis C Screening  Completed  ? HIV Screening  Completed  ? HPV VACCINES  Aged Out  ?  ? ? ?Review of Systems ?Patient reports no vision/ hearing changes, adenopathy,fever, persistant/recurrent hoarseness , swallowing issues, chest pain, palpitations, edema, persistant/recurrent cough, hemoptysis, dyspnea (rest/exertional/paroxysmal nocturnal), gastrointestinal bleeding (melena, rectal bleeding), abdominal pain, significant heartburn, bowel changes, GU symptoms (dysuria, hematuria, incontinence), Gyn symptoms (abnormal  bleeding, pain),  syncope, focal weakness, memory loss, numbness & tingling, skin/hair/nail changes, abnormal bruising or bleeding, anxiety, or depression.  ? ?+ 10 lb weight gain ?   ?Objective:  ? Physical Exam ?General Appearance:    Alert, cooperative, no distress, appears stated age  ?Head:    Normocephalic, without obvious abnormality, atraumatic  ?Eyes:    PERRL, conjunctiva/corneas clear, EOM's intact, both eyes  ?Ears:    Normal TM's and external ear canals, both ears  ?Nose:   Nares normal, septum midline, mucosa normal, no drainage  ?  or sinus tenderness  ?Throat:   Lips, mucosa, and tongue normal; teeth and gums normal  ?Neck:   Supple, symmetrical, trachea midline, no adenopathy;  ?  Thyroid: no enlargement/tenderness/nodules  ?Back:     Symmetric, no curvature, ROM normal, no CVA tenderness  ?Lungs:     Clear to  auscultation bilaterally, respirations unlabored  ?Chest Wall:    No tenderness or deformity  ? Heart:    Regular rate and rhythm, S1 and S2 normal, no murmur, rub ?  or gallop  ?Breast Exam:    Deferred to GYN  ?Abdomen:     Soft, non-tender, bowel sounds active all four quadrants,  ?  no masses, no organomegaly  ?Genitalia:    Deferred to GYN  ?Rectal:    ?Extremities:   Extremities normal, atraumatic, no cyanosis or edema  ?Pulses:   2+ and symmetric all extremities  ?Skin:   Skin color, texture, turgor normal, no rashes or lesions  ?Lymph nodes:   Cervical, supraclavicular, and axillary nodes normal  ?Neurologic:   CNII-XII intact, normal strength, sensation and reflexes  ?  throughout  ?  ? ? ? ?   ?Assessment & Plan:  ? ? ?

## 2022-03-28 NOTE — Assessment & Plan Note (Signed)
Pt's PE WNL.  UTD on Tdap.  Plans to schedule repeat pap.  Check labs due to BMI of 27.07  Anticipatory guidance provided.  ?

## 2022-05-23 DIAGNOSIS — D2271 Melanocytic nevi of right lower limb, including hip: Secondary | ICD-10-CM | POA: Diagnosis not present

## 2022-05-23 DIAGNOSIS — D485 Neoplasm of uncertain behavior of skin: Secondary | ICD-10-CM | POA: Diagnosis not present

## 2022-05-23 DIAGNOSIS — D2261 Melanocytic nevi of right upper limb, including shoulder: Secondary | ICD-10-CM | POA: Diagnosis not present

## 2023-03-31 ENCOUNTER — Ambulatory Visit (INDEPENDENT_AMBULATORY_CARE_PROVIDER_SITE_OTHER): Payer: BC Managed Care – PPO | Admitting: Family Medicine

## 2023-03-31 ENCOUNTER — Encounter: Payer: Self-pay | Admitting: Family Medicine

## 2023-03-31 ENCOUNTER — Telehealth: Payer: Self-pay

## 2023-03-31 VITALS — BP 128/60 | HR 68 | Temp 98.1°F | Ht 61.5 in | Wt 150.8 lb

## 2023-03-31 DIAGNOSIS — Z0000 Encounter for general adult medical examination without abnormal findings: Secondary | ICD-10-CM

## 2023-03-31 DIAGNOSIS — N912 Amenorrhea, unspecified: Secondary | ICD-10-CM

## 2023-03-31 DIAGNOSIS — E663 Overweight: Secondary | ICD-10-CM | POA: Diagnosis not present

## 2023-03-31 LAB — CBC WITH DIFFERENTIAL/PLATELET
Basophils Absolute: 0 10*3/uL (ref 0.0–0.1)
Basophils Relative: 0.8 % (ref 0.0–3.0)
Eosinophils Absolute: 0.1 10*3/uL (ref 0.0–0.7)
Eosinophils Relative: 1.3 % (ref 0.0–5.0)
HCT: 41.1 % (ref 36.0–46.0)
Hemoglobin: 14.5 g/dL (ref 12.0–15.0)
Lymphocytes Relative: 34.5 % (ref 12.0–46.0)
Lymphs Abs: 1.6 10*3/uL (ref 0.7–4.0)
MCHC: 35.4 g/dL (ref 30.0–36.0)
MCV: 87.7 fl (ref 78.0–100.0)
Monocytes Absolute: 0.3 10*3/uL (ref 0.1–1.0)
Monocytes Relative: 6 % (ref 3.0–12.0)
Neutro Abs: 2.7 10*3/uL (ref 1.4–7.7)
Neutrophils Relative %: 57.4 % (ref 43.0–77.0)
Platelets: 244 10*3/uL (ref 150.0–400.0)
RBC: 4.68 Mil/uL (ref 3.87–5.11)
RDW: 11.7 % (ref 11.5–15.5)
WBC: 4.6 10*3/uL (ref 4.0–10.5)

## 2023-03-31 LAB — LIPID PANEL
Cholesterol: 194 mg/dL (ref 0–200)
HDL: 53.4 mg/dL (ref >39.00)
LDL Cholesterol: 120 mg/dL — ABNORMAL HIGH (ref 0–99)
NonHDL: 140.44
Total CHOL/HDL Ratio: 4
Triglycerides: 100 mg/dL (ref 0.0–149.0)
VLDL: 20 mg/dL (ref 0.0–40.0)

## 2023-03-31 LAB — TSH: TSH: 1.77 u[IU]/mL (ref 0.35–5.50)

## 2023-03-31 LAB — HEPATIC FUNCTION PANEL
ALT: 17 U/L (ref 0–35)
AST: 20 U/L (ref 0–37)
Albumin: 4.4 g/dL (ref 3.5–5.2)
Alkaline Phosphatase: 69 U/L (ref 39–117)
Bilirubin, Direct: 0.1 mg/dL (ref 0.0–0.3)
Total Bilirubin: 0.5 mg/dL (ref 0.2–1.2)
Total Protein: 7.2 g/dL (ref 6.0–8.3)

## 2023-03-31 LAB — BASIC METABOLIC PANEL
BUN: 14 mg/dL (ref 6–23)
CO2: 25 mEq/L (ref 19–32)
Calcium: 9.4 mg/dL (ref 8.4–10.5)
Chloride: 103 mEq/L (ref 96–112)
Creatinine, Ser: 0.7 mg/dL (ref 0.40–1.20)
GFR: 115.13 mL/min (ref >60.00)
Glucose, Bld: 98 mg/dL (ref 70–99)
Potassium: 4 mEq/L (ref 3.5–5.1)
Sodium: 136 mEq/L (ref 135–145)

## 2023-03-31 LAB — HCG, QUANTITATIVE, PREGNANCY: Quantitative HCG: <0.6 m[IU]/mL

## 2023-03-31 NOTE — Patient Instructions (Signed)
Follow up in 1 year or as needed We'll notify you of your lab results and make any changes if needed Keep up the good work on healthy diet and regular exercise- you look great!!! Call with any questions or concerns Stay Safe!  Stay Healthy! Happy Spring!!! 

## 2023-03-31 NOTE — Telephone Encounter (Signed)
-----   Message from Sheliah Hatch, MD sent at 03/31/2023  2:54 PM EDT ----- Labs look great!  No changes at this time (and not pregnant!)

## 2023-03-31 NOTE — Progress Notes (Signed)
   Subjective:    Patient ID: Samantha Mcpherson, female    DOB: 1990-12-22, 32 y.o.   MRN: [id number]  HPI CPE- UTD on pap, Tdap  Patient Care Team    Relationship Specialty Notifications Start End  Sheliah Hatch, MD PCP - General Family Medicine  02/18/21   Mitchel Honour, DO Consulting Physician Obstetrics and Gynecology  03/11/21      Health Maintenance  Topic Date Due   COVID-19 Vaccine (3 - 2023-24 season) 08/12/2022   INFLUENZA VACCINE  07/13/2023   PAP SMEAR-Modifier  07/22/2024   DTaP/Tdap/Td (3 - Td or Tdap) 03/12/2031   Hepatitis C Screening  Completed   HIV Screening  Completed   HPV VACCINES  Aged Out      Review of Systems Patient reports no vision/ hearing changes, adenopathy,fever, weight change,  persistant/recurrent hoarseness , swallowing issues, chest pain, palpitations, edema, persistant/recurrent cough, hemoptysis, dyspnea (rest/exertional/paroxysmal nocturnal), gastrointestinal bleeding (melena, rectal bleeding), abdominal pain, significant heartburn, bowel changes, GU symptoms (dysuria, hematuria, incontinence),  syncope, focal weakness, memory loss, numbness & tingling, skin/hair/nail changes, abnormal bruising or bleeding, anxiety, or depression.   + amenorrhea- last period was early Feb.  Has multiple negative pregnancy tests.  + bloating after eating her cheat meal    Objective:   Physical Exam General Appearance:    Alert, cooperative, no distress, appears stated age  Head:    Normocephalic, without obvious abnormality, atraumatic  Eyes:    PERRL, conjunctiva/corneas clear, EOM's intact both eyes  Ears:    Normal TM's and external ear canals, both ears  Nose:   Nares normal, septum midline, mucosa normal, no drainage    or sinus tenderness  Throat:   Lips, mucosa, and tongue normal; teeth and gums normal  Neck:   Supple, symmetrical, trachea midline, no adenopathy;    Thyroid: no enlargement/tenderness/nodules  Back:     Symmetric, no curvature,  ROM normal, no CVA tenderness  Lungs:     Clear to auscultation bilaterally, respirations unlabored  Chest Wall:    No tenderness or deformity   Heart:    Regular rate and rhythm, S1 and S2 normal, no murmur, rub   or gallop  Breast Exam:    Deferred to GYN  Abdomen:     Soft, non-tender, bowel sounds active all four quadrants,    no masses, no organomegaly  Genitalia:    Deferred to GYN  Rectal:    Extremities:   Extremities normal, atraumatic, no cyanosis or edema  Pulses:   2+ and symmetric all extremities  Skin:   Skin color, texture, turgor normal, no rashes or lesions  Lymph nodes:   Cervical, supraclavicular, and axillary nodes normal  Neurologic:   CNII-XII intact, normal strength, sensation and reflexes    throughout          Assessment & Plan:

## 2023-03-31 NOTE — Telephone Encounter (Signed)
Pt called back. °

## 2023-03-31 NOTE — Assessment & Plan Note (Signed)
Pt's PE WNL.  UTD on pap, Tdap.  Check labs.  Anticipatory guidance provided.  

## 2023-03-31 NOTE — Telephone Encounter (Signed)
Pt informed

## 2023-04-12 DIAGNOSIS — D225 Melanocytic nevi of trunk: Secondary | ICD-10-CM | POA: Diagnosis not present

## 2023-04-12 DIAGNOSIS — D2362 Other benign neoplasm of skin of left upper limb, including shoulder: Secondary | ICD-10-CM | POA: Diagnosis not present

## 2023-04-12 DIAGNOSIS — L814 Other melanin hyperpigmentation: Secondary | ICD-10-CM | POA: Diagnosis not present

## 2023-04-12 DIAGNOSIS — L821 Other seborrheic keratosis: Secondary | ICD-10-CM | POA: Diagnosis not present

## 2023-04-12 DIAGNOSIS — Z872 Personal history of diseases of the skin and subcutaneous tissue: Secondary | ICD-10-CM | POA: Diagnosis not present

## 2023-04-12 DIAGNOSIS — D485 Neoplasm of uncertain behavior of skin: Secondary | ICD-10-CM | POA: Diagnosis not present

## 2023-05-11 DIAGNOSIS — Z01419 Encounter for gynecological examination (general) (routine) without abnormal findings: Secondary | ICD-10-CM | POA: Diagnosis not present

## 2023-05-11 DIAGNOSIS — Z6827 Body mass index (BMI) 27.0-27.9, adult: Secondary | ICD-10-CM | POA: Diagnosis not present

## 2023-05-11 DIAGNOSIS — N915 Oligomenorrhea, unspecified: Secondary | ICD-10-CM | POA: Diagnosis not present

## 2023-05-19 DIAGNOSIS — L905 Scar conditions and fibrosis of skin: Secondary | ICD-10-CM | POA: Diagnosis not present

## 2023-05-19 DIAGNOSIS — D485 Neoplasm of uncertain behavior of skin: Secondary | ICD-10-CM | POA: Diagnosis not present

## 2023-07-16 ENCOUNTER — Other Ambulatory Visit: Payer: Self-pay | Admitting: Family Medicine

## 2023-10-20 DIAGNOSIS — L821 Other seborrheic keratosis: Secondary | ICD-10-CM | POA: Diagnosis not present

## 2023-10-20 DIAGNOSIS — Z09 Encounter for follow-up examination after completed treatment for conditions other than malignant neoplasm: Secondary | ICD-10-CM | POA: Diagnosis not present

## 2023-10-20 DIAGNOSIS — D225 Melanocytic nevi of trunk: Secondary | ICD-10-CM | POA: Diagnosis not present

## 2023-10-20 DIAGNOSIS — L814 Other melanin hyperpigmentation: Secondary | ICD-10-CM | POA: Diagnosis not present

## 2024-04-01 ENCOUNTER — Encounter: Payer: Self-pay | Admitting: Family Medicine

## 2024-04-01 ENCOUNTER — Ambulatory Visit (INDEPENDENT_AMBULATORY_CARE_PROVIDER_SITE_OTHER): Payer: BC Managed Care – PPO | Admitting: Family Medicine

## 2024-04-01 VITALS — BP 104/62 | HR 68 | Temp 98.0°F | Ht 61.5 in | Wt 147.0 lb

## 2024-04-01 DIAGNOSIS — Z0000 Encounter for general adult medical examination without abnormal findings: Secondary | ICD-10-CM

## 2024-04-01 DIAGNOSIS — E663 Overweight: Secondary | ICD-10-CM

## 2024-04-01 LAB — CBC WITH DIFFERENTIAL/PLATELET
Basophils Absolute: 0 10*3/uL (ref 0.0–0.1)
Basophils Relative: 0.7 % (ref 0.0–3.0)
Eosinophils Absolute: 0.1 10*3/uL (ref 0.0–0.7)
Eosinophils Relative: 1.4 % (ref 0.0–5.0)
HCT: 42.2 % (ref 36.0–46.0)
Hemoglobin: 14.3 g/dL (ref 12.0–15.0)
Lymphocytes Relative: 31.8 % (ref 12.0–46.0)
Lymphs Abs: 1.7 10*3/uL (ref 0.7–4.0)
MCHC: 33.8 g/dL (ref 30.0–36.0)
MCV: 90.4 fl (ref 78.0–100.0)
Monocytes Absolute: 0.3 10*3/uL (ref 0.1–1.0)
Monocytes Relative: 5.2 % (ref 3.0–12.0)
Neutro Abs: 3.2 10*3/uL (ref 1.4–7.7)
Neutrophils Relative %: 60.9 % (ref 43.0–77.0)
Platelets: 265 10*3/uL (ref 150.0–400.0)
RBC: 4.67 Mil/uL (ref 3.87–5.11)
RDW: 12.3 % (ref 11.5–15.5)
WBC: 5.3 10*3/uL (ref 4.0–10.5)

## 2024-04-01 LAB — LIPID PANEL
Cholesterol: 193 mg/dL (ref 0–200)
HDL: 57.1 mg/dL (ref >39.00)
LDL Cholesterol: 117 mg/dL — ABNORMAL HIGH (ref 0–99)
NonHDL: 136.02
Total CHOL/HDL Ratio: 3
Triglycerides: 95 mg/dL (ref 0.0–149.0)
VLDL: 19 mg/dL (ref 0.0–40.0)

## 2024-04-01 LAB — BASIC METABOLIC PANEL WITH GFR
BUN: 15 mg/dL (ref 6–23)
CO2: 27 meq/L (ref 19–32)
Calcium: 9.3 mg/dL (ref 8.4–10.5)
Chloride: 103 meq/L (ref 96–112)
Creatinine, Ser: 0.74 mg/dL (ref 0.40–1.20)
GFR: 106.95 mL/min (ref >60.00)
Glucose, Bld: 93 mg/dL (ref 70–99)
Potassium: 4.2 meq/L (ref 3.5–5.1)
Sodium: 136 meq/L (ref 135–145)

## 2024-04-01 LAB — HEPATIC FUNCTION PANEL
ALT: 12 U/L (ref 0–35)
AST: 14 U/L (ref 0–37)
Albumin: 4.4 g/dL (ref 3.5–5.2)
Alkaline Phosphatase: 60 U/L (ref 39–117)
Bilirubin, Direct: 0.1 mg/dL (ref 0.0–0.3)
Total Bilirubin: 0.5 mg/dL (ref 0.2–1.2)
Total Protein: 7.3 g/dL (ref 6.0–8.3)

## 2024-04-01 LAB — TSH: TSH: 1.66 u[IU]/mL (ref 0.35–5.50)

## 2024-04-01 MED ORDER — RIZATRIPTAN BENZOATE 5 MG PO TABS: 5.0000 mg | ORAL_TABLET | ORAL | 8 refills | Status: AC | PRN

## 2024-04-01 NOTE — Telephone Encounter (Signed)
-----   Message from Laymon Priest sent at 04/01/2024  1:41 PM EDT ----- Labs look great!  No changes at this time

## 2024-04-01 NOTE — Assessment & Plan Note (Signed)
 Pt's PE WNL.  UTD on pap, Tdap.  Too young for mammogram.  Check labs.  Anticipatory guidance provided.

## 2024-04-01 NOTE — Patient Instructions (Signed)
 Follow up in 1 year or as needed We'll notify you of your lab results and make any changes if needed Keep up the good work on healthy diet and regular exercise- you look FANTASTIC! Call with any questions or concerns Stay Safe!  Stay Healthy! Have a great week!

## 2024-04-01 NOTE — Progress Notes (Signed)
   Subjective:    Patient ID: Samantha Mcpherson, female    DOB: Mar 19, 1991, 33 y.o.   MRN: [id number]  HPI CPE- UTD on pap, Tdap.  Down 5 lbs, exercising 5 days/week.    Patient Care Team    Relationship Specialty Notifications Start End  Jess Morita, MD PCP - General Family Medicine  02/18/21   Dyanna Glasgow, DO Consulting Physician Obstetrics and Gynecology  03/11/21     Health Maintenance  Topic Date Due   COVID-19 Vaccine (3 - 2024-25 season) 08/13/2023   INFLUENZA VACCINE  07/12/2024   Cervical Cancer Screening (HPV/Pap Cotest)  07/22/2024   DTaP/Tdap/Td (3 - Td or Tdap) 03/12/2031   Hepatitis C Screening  Completed   HIV Screening  Completed   HPV VACCINES  Aged Out   Meningococcal B Vaccine  Aged Out      Review of Systems Patient reports no vision/ hearing changes, adenopathy, fever, persistant/recurrent hoarseness , swallowing issues, chest pain, palpitations, edema, persistant/recurrent cough, hemoptysis, dyspnea (rest/exertional/paroxysmal nocturnal), gastrointestinal bleeding (melena, rectal bleeding), abdominal pain, significant heartburn, bowel changes, GU symptoms (dysuria, hematuria, incontinence), Gyn symptoms (abnormal  bleeding, pain),  syncope, focal weakness, memory loss, numbness & tingling, skin/hair/nail changes, abnormal bruising or bleeding, anxiety, or depression.     Objective:   Physical Exam General Appearance:    Alert, cooperative, no distress, appears stated age  Head:    Normocephalic, without obvious abnormality, atraumatic  Eyes:    PERRL, conjunctiva/corneas clear, EOM's intact both eyes  Ears:    Normal TM's and external ear canals, both ears  Nose:   Nares normal, septum midline, mucosa normal, no drainage    or sinus tenderness  Throat:   Lips, mucosa, and tongue normal; teeth and gums normal  Neck:   Supple, symmetrical, trachea midline, no adenopathy;    Thyroid : no enlargement/tenderness/nodules  Back:     Symmetric, no curvature,  ROM normal, no CVA tenderness  Lungs:     Clear to auscultation bilaterally, respirations unlabored  Chest Wall:    No tenderness or deformity   Heart:    Regular rate and rhythm, S1 and S2 normal, no murmur, rub   or gallop  Breast Exam:    Deferred to GYN  Abdomen:     Soft, non-tender, bowel sounds active all four quadrants,    no masses, no organomegaly  Genitalia:    Deferred to GYN  Rectal:    Extremities:   Extremities normal, atraumatic, no cyanosis or edema  Pulses:   2+ and symmetric all extremities  Skin:   Skin color, texture, turgor normal, no rashes or lesions  Lymph nodes:   Cervical, supraclavicular, and axillary nodes normal  Neurologic:   CNII-XII intact, normal strength, sensation and reflexes    throughout          Assessment & Plan:

## 2024-04-01 NOTE — Telephone Encounter (Signed)
 Lab results have been discussed.   Verbalized understanding? Yes  Are there any questions? No

## 2024-09-18 DIAGNOSIS — Z01419 Encounter for gynecological examination (general) (routine) without abnormal findings: Secondary | ICD-10-CM | POA: Diagnosis not present

## 2024-09-18 DIAGNOSIS — R87810 Cervical high risk human papillomavirus (HPV) DNA test positive: Secondary | ICD-10-CM | POA: Diagnosis not present

## 2024-09-18 DIAGNOSIS — Z1151 Encounter for screening for human papillomavirus (HPV): Secondary | ICD-10-CM | POA: Diagnosis not present

## 2024-09-18 DIAGNOSIS — Z6826 Body mass index (BMI) 26.0-26.9, adult: Secondary | ICD-10-CM | POA: Diagnosis not present

## 2024-09-18 DIAGNOSIS — Z124 Encounter for screening for malignant neoplasm of cervix: Secondary | ICD-10-CM | POA: Diagnosis not present

## 2024-10-01 DIAGNOSIS — R8789 Other abnormal findings in specimens from female genital organs: Secondary | ICD-10-CM | POA: Diagnosis not present

## 2024-10-01 DIAGNOSIS — R87622 Low grade squamous intraepithelial lesion on cytologic smear of vagina (LGSIL): Secondary | ICD-10-CM | POA: Diagnosis not present

## 2024-10-01 DIAGNOSIS — Z3202 Encounter for pregnancy test, result negative: Secondary | ICD-10-CM | POA: Diagnosis not present
